# Patient Record
Sex: Female | Born: 1950
Health system: Southern US, Community
[De-identification: ages and names within clinical notes are randomized; demographics above are authoritative.]

## PROBLEM LIST (undated history)

## (undated) DIAGNOSIS — E119 Type 2 diabetes mellitus without complications: Secondary | ICD-10-CM

## (undated) DIAGNOSIS — I10 Essential (primary) hypertension: Secondary | ICD-10-CM

## (undated) DIAGNOSIS — E7800 Pure hypercholesterolemia, unspecified: Secondary | ICD-10-CM

## (undated) HISTORY — PX: DILATION AND CURETTAGE OF UTERUS: SHX78

---

## 2001-04-12 ENCOUNTER — Emergency Department (HOSPITAL_COMMUNITY): Admission: EM | Admit: 2001-04-12 | Discharge: 2001-04-13 | Payer: Self-pay | Admitting: Emergency Medicine

## 2002-07-09 ENCOUNTER — Emergency Department (HOSPITAL_COMMUNITY): Admission: EM | Admit: 2002-07-09 | Discharge: 2002-07-09 | Payer: Self-pay | Admitting: *Deleted

## 2002-07-10 ENCOUNTER — Emergency Department (HOSPITAL_COMMUNITY): Admission: EM | Admit: 2002-07-10 | Discharge: 2002-07-10 | Payer: Self-pay | Admitting: *Deleted

## 2003-07-05 ENCOUNTER — Ambulatory Visit (HOSPITAL_COMMUNITY): Admission: RE | Admit: 2003-07-05 | Discharge: 2003-07-05 | Payer: Self-pay | Admitting: Family Medicine

## 2004-08-05 ENCOUNTER — Inpatient Hospital Stay (HOSPITAL_COMMUNITY): Admission: EM | Admit: 2004-08-05 | Discharge: 2004-08-11 | Payer: Self-pay | Admitting: Emergency Medicine

## 2004-08-11 ENCOUNTER — Encounter (HOSPITAL_COMMUNITY): Admission: RE | Admit: 2004-08-11 | Discharge: 2004-09-10 | Payer: Self-pay | Admitting: Family Medicine

## 2004-08-11 ENCOUNTER — Inpatient Hospital Stay (HOSPITAL_COMMUNITY): Admission: EM | Admit: 2004-08-11 | Discharge: 2004-08-16 | Payer: Self-pay | Admitting: Emergency Medicine

## 2004-09-06 ENCOUNTER — Ambulatory Visit (HOSPITAL_COMMUNITY): Admission: RE | Admit: 2004-09-06 | Discharge: 2004-09-06 | Payer: Self-pay | Admitting: Family Medicine

## 2007-12-16 ENCOUNTER — Ambulatory Visit (HOSPITAL_COMMUNITY): Admission: RE | Admit: 2007-12-16 | Discharge: 2007-12-16 | Payer: Self-pay | Admitting: Family Medicine

## 2009-03-02 ENCOUNTER — Ambulatory Visit (HOSPITAL_COMMUNITY): Admission: RE | Admit: 2009-03-02 | Discharge: 2009-03-02 | Payer: Self-pay | Admitting: Family Medicine

## 2010-03-20 ENCOUNTER — Ambulatory Visit (HOSPITAL_COMMUNITY)
Admission: RE | Admit: 2010-03-20 | Discharge: 2010-03-20 | Payer: Self-pay | Source: Home / Self Care | Attending: Family Medicine | Admitting: Family Medicine

## 2010-08-25 NOTE — Consult Note (Signed)
NAME:  WINDSOR, ZIRKELBACH NO.:  [id number]   MEDICAL RECORD NO.:  [id number]          PATIENT TYPE:  INP   LOCATION:  A226                          FACILITY:  [facility]   PHYSICIAN:  Kofi A. Gerilyn Pilgrim, M.D. DATE OF BIRTH:  [birth year]   DATE OF CONSULTATION:  DATE OF DISCHARGE:                                   CONSULTATION   ASSESSMENT/PLAN:  I believe the pieces of this case are beginning to come  together.  It appears that though she has some alteration of mentation, so  far no clear etiology has been ruled in.  Certainly, a spinal tap would be  good, but she has vigorously resisted this.  I believe that there is a  significant underlying psychiatric problem, especially with new information  that she was hospitalized for a month with bipolar disorder.  We will  complete the course for presumed diagnosis of possible encephalitis.  If she  is willing to be tapped, we may consider tapping her to rule this out;  however, ultimately, I think she will need to have psychiatric therapy.  Again, there is a question in the past of possible seizures but EEG was  normal before and also MRI was normal.  We are also going to start her on  Zyprexa.  Due to elevated liver enzymes, we will hold off on the Depakote  and continue with the Dilantin.   HISTORY:  This is a 60 year old lady who was recently admitted to this  hospital and discharged yesterday.  She presented with unresponsiveness.  From the onset, there is some question of whether or not there is some  volitional problems; however, the patient was having low-grade temperatures  and at one time, I think, had a 100.8 fever.  The workup was essentially  unremarkable.  MRI of the brain was unrevealing.  EEG was normal.  Other  tests, including urinalysis and chest x-ray, were normal.   The patient was intubated and rapidly extubated.  Attempted LP was obtained,  but the patient was very combative and remained combative for most of  her  stay.  She improved gradually and was discharged.  She was treated  empirically with Rocephin and acyclovir, as we could not rule out primary  CNS infection.  She went home and apparently was trying to go to her car,  and her husband tried to restrain her.  She apparently held on and went into  a rage-like mode.  She would not let go from the car.  She became  unresponsive again and was subsequently taken to the emergency room.  In the  emergency room, it appeared that she was apparently holding on to the car,  would not move.  Her eyes were closed.  She was given 8 mg of Haldol and 3mg   ofAtivan .  She apparently has come around and is more cooperative, less  combative.  She does not complain of a headache.  She really has not had any  complaints.   PHYSICAL EXAMINATION:  VITAL SIGNS:  She is afebrile.  Temperature 97.1,  pulse 108, respirations 18,  blood pressure 154/100.  GENERAL:  Patient is smiling but is acting somewhat immature for age.  She  has low spontaneous speech and slowly answers questions.  NEUROLOGIC:  Cranial nerves II-XII are intact.  She moves all four  extremities well.   WBC 7, hemoglobin 10, platelet count 195.  She had a low potassium of 2.7.  Sodium 137.  Chloride 103, CO2 24, glucose 122, creatinine 0.9.  ALT 221,  AST 227.  Calcium 8.3, TSH 1.8.  Alcohol level undetected.  Phenytoin  11.8.  Acetaminophen undetected.  Urinalysis negative.  ANA from previous  hospitalization nonreactive.  RPR nonreactive.   Thanks for this consultation.      KAD/MEDQ  D:  08/11/2004  T:  08/11/2004  Job:  [id number]

## 2010-08-25 NOTE — Group Therapy Note (Signed)
NAME:  Samantha Werner, Samantha Werner NO.:  [id number]   MEDICAL RECORD NO.:  [id number]          PATIENT TYPE:  INP   LOCATION:  A226                          FACILITY:  [facility]   PHYSICIAN:  Angus G. Renard Matter, MD   DATE OF BIRTH:  Mar 07, 1951   DATE OF PROCEDURE:  08/10/2004  DATE OF DISCHARGE:                                   PROGRESS NOTE   SUBJECTIVE:  This patient was admitted with unexplained altered mental  status. She was moved from the ICU to __2A________________ telemetry.  Appears to be more alert.   OBJECTIVE:  VITAL SIGNS:  Blood pressure 143/90, respirations 16, pulse 107,  temperature 99.2.  LUNGS:  Clear to P&A.  HEART:  Regular rhythm.  ABDOMEN:  No palpable organs or masses.   ASSESSMENT:  The patient was admitted with altered mental status, possible  herpetic encephalitis.   PLAN:  Plan to continue current regimen.      AGM/MEDQ  D:  08/10/2004  T:  08/10/2004  Job:  [id number]

## 2010-08-25 NOTE — Group Therapy Note (Signed)
NAME:  Samantha Werner, Samantha Werner NO.:  [id number]   MEDICAL RECORD NO.:  [id number]          PATIENT TYPE:  INP   LOCATION:  IC03                          FACILITY:  [facility]   PHYSICIAN:  Angus G. Renard Matter, MD   DATE OF BIRTH:  Jul 02, 1950   DATE OF PROCEDURE:  DATE OF DISCHARGE:                                   PROGRESS NOTE   SUBJECTIVE:  The patient remains on a respirator, was somewhat agitated and  had to be medicated. She had a change in mental status of unknown cause thus  concerns about herpetic encephalitis and is being treated for this.   OBJECTIVE:  Vital signs, blood pressure 113/69, respirations 10/8, pulse  120, temperature 100. Lungs occasional rhonchus bilaterally. Heart regular  rhythm. Abdomen no palpable __________ or masses. The patient remains  comatose on a respirator.   PLAN:  Continue current regimen. The patient will be seen today by Kofi A.  Gerilyn Pilgrim, M.D.      AGM/MEDQ  D:  08/07/2004  T:  08/07/2004  Job:  [id number]

## 2010-08-25 NOTE — Discharge Summary (Signed)
NAME:  Samantha Werner, Samantha Werner NO.:  [id number]   MEDICAL RECORD NO.:  [id number]          PATIENT TYPE:  INP   LOCATION:  A226                          FACILITY:  [facility]   PHYSICIAN:  Angus G. Renard Matter, MD   DATE OF BIRTH:  20-Nov-1950   DATE OF ADMISSION:  08/11/2004  DATE OF DISCHARGE:  05/10/2006LH                                 DISCHARGE SUMMARY   Five days hospitalization.   DIAGNOSIS:  Combative behavior.   PROBLEM LIST:  Encephalopathy.  History of bipolar disorder.   CONDITION:  Stable and improved at the time of her discharge.   HISTORY OF PRESENT ILLNESS:  This 60 year old African-American female had  been discharged after having spent five days in the hospital.  She was first  admitted to the ED after having become unresponsive at home with mental  confusion and diminished mental status.  She does apparently have a history  of bipolar disorder and had been treated by Dr. Betti Cruz of mental health in  the past and had been on medication for bipolar disorder.  Because of  unresponsiveness, she was electively placed on respirator.  She was admitted  to ICU on her first admission and remained on respirator approximately three  days.  She was seen in consultation by Dr. Juanetta Gosling and Dr. Gerilyn Pilgrim of  neurology.  There was some question concerning whether or not she might have  experience encephalopathy.  A CT of the head was negative.  MRI of the brain  was negative.  EEG was negative.  She could not have a lumbar puncture  because of agitated state.  It was felt she could be discharged home on 14-  day regimen of Rocephin.  This was arranged through home health.  Apparently, she had not been home many hours when according to her husband  she asked for the keys of their car and attempted to drive off in the car.  When he stopped her, she became extremely angry, fought with him.  Apparently, his associates brought her back to the ED.  She was extremely  agitated and  combative.  She was evaluated by ED physician, was given Haldol  which calmed her some.  It was felt she should be brought back to the  hospital to rule out organic causes of her emotional state, and if organic  causes are ruled out, entertain possible psychiatric admission.   PHYSICAL EXAMINATION:  Blood pressure 159/97, heart rate 116, respirations  18.  HEENT:  Eyes:  PERRLA.  TMs are negative.  Oropharynx benign.  Neck is  supple with no JVD or abnormalities.  Lungs:  Clear to auscultation and  percussion.  Heart:  Sinus tachycardia.  Abdomen:  No palpable organs or  masses.  Skin:  Warm and dry.  Extremities:  Free of edema.  Neurologic:  No  focal deficit.   LABORATORY DATA:  Admission CBC WBC 7800, hemoglobin 10.4, hematocrit 30.6.  Chemistries:  Sodium 137, potassium 2.7, chloride 103, CO2 24, glucose 122,  BUN 4, creatinine 0.9.  Calcium 8.3, total protein 6.5.  Subsequent  chemistries on 08/13/04.  Sodium 138, potassium 3.3, chloride 104, CO2 34,  glucose 88, BUN 3.  Again, on Aug 15, 2004, sodium 139, potassium 4.3,  chloride 105, CO2 31, glucose 88, BUN 3, creatinine 0.8.  Liver enzymes:  SGOT 276, SGPT 221, alkaline phosphatase 95, total bilirubin 0.4.  Subsequent liver enzymes Aug 15, 2004.  SGOT 90, SGPT 170, alkaline  phosphatase 110, bilirubin 0.2.  Urinalysis negative.  Electrocardiogram:  Sinus tachycardia.   HOSPITAL COURSE:  The patient was re-admitted and started on a soft diet and  half normal saline 100 mL/hour.  IV Ativan was given q.4h. p.r.n. for  agitation.  She was continued on IV Rocephin 1 g daily.  Acyclovir 10 mg/kg  q.8h. was continued.  Dilantin 100 mg t.i.d. was continued.  She was given  three runs of KCL, 10  milliequivalents, and this was subsequently based on  Zyprexa 10 mg.  She again had to have intravenous potassium.  On Aug 12, 2004, she received two runs of KCl 10 milliequivalents.  Subsequently, based  on KCL p.o. 20 milliequivalents b.i.d., the  patient showed progressive  improvement on this occasion.  She did have to have her potassium corrected.  She was much calmer towards the latter part of her hospital stay, and it was  felt that psychiatric admission was not indicated, although appointments  were made for her to be seen by mental health facility locally.  She was  able to be discharged on Dilantin 100 mg t.i.d., Zyprexa 10 mg daily, K-Dur  20 milliequivalents daily, IV Rocephin 1 g daily to be continued as an  outpatient.      AGM/MEDQ  D:  09/05/2004  T:  09/05/2004  Job:  [id number]

## 2010-08-25 NOTE — Group Therapy Note (Signed)
NAME:  Samantha Werner, Samantha Werner NO.:  [id number]   MEDICAL RECORD NO.:  [id number]          PATIENT TYPE:  INP   LOCATION:  IC03                          FACILITY:  [facility]   PHYSICIAN:  Kofi A. Gerilyn Pilgrim, M.D. DATE OF BIRTH:  Oct 31, 1950   DATE OF PROCEDURE:  08/08/2004  DATE OF DISCHARGE:                                   PROGRESS NOTE   The patient's T-max. was 100.5, blood pressure 122/82, pulse 106,  respirations 18.  The nurse says the patient was more responsive during the  daytime today. This was also concurred by her husband. She apparently was  complaining about the nasal cannular and wanted this to be removed.  The  nurse asked her if she would awaken and eat her meal. Iif she would be able  to have it removed.  She suddenly got up and ate her food.  She has been  less responsive this evening.   On examination, she lies in bed with eyes closed.  She vigorously resists  eye opening, turning her face away in the other direction and lifting her  hands up.  She moves all four extremities vigorously.  Dilantin level is  15.3.   ASSESSMENT AND PLAN:  Unexplained altered mental status.  Continue with  current antibiotics.  EEG has been ordered, and we will follow up results.  We will also go ahead and order MRI scan.      KAD/MEDQ  D:  08/08/2004  T:  08/08/2004  Job:  [id number]

## 2010-08-25 NOTE — Group Therapy Note (Signed)
NAME:  Samantha Werner, Samantha Werner NO.:  [id number]   MEDICAL RECORD NO.:  [id number]          PATIENT TYPE:  INP   LOCATION:  IC03                          FACILITY:  [facility]   PHYSICIAN:  Edward L. Juanetta Gosling, M.D.DATE OF BIRTH:  October 16, 1950   DATE OF PROCEDURE:  08/05/2004  DATE OF DISCHARGE:                                   PROGRESS NOTE   I have discussed by phone Ms. Wickens situation with Dr. Gerilyn Pilgrim and he  agrees that we should go ahead with treatment for herpes encephalitis, add  Rocephin and he will see her later.  He is not on this weekend and probably  will see her next week but he thinks that we should go ahead with these  interventions and did not have any other suggestions on telephone  consultation.      ELH/MEDQ  D:  08/05/2004  T:  08/05/2004  Job:  [id number]   cc:   Darleen Crocker A. Gerilyn Pilgrim, M.D.  [address], [place]  [place] [facility] [place]  [place] [address]  Fax: [phone]

## 2010-08-25 NOTE — Group Therapy Note (Signed)
NAME:  NOVALYNN, BRANAMAN NO.:  [id number]   MEDICAL RECORD NO.:  [id number]          PATIENT TYPE:  INP   LOCATION:  A226                          FACILITY:  [facility]   PHYSICIAN:  Mila Homer. Sudie Bailey, M.D.DATE OF BIRTH:  [birth year]   DATE OF PROCEDURE:  08/13/2004  DATE OF DISCHARGE:                                   PROGRESS NOTE   SUBJECTIVE:  The patient has really no complaints today.   OBJECTIVE:  VITAL SIGNS:  Temperature is 98.7, pulse 98, respiratory rate  18, blood pressure 120/76.  GENERAL:  She is sitting up in bed.  She is in no acute distress.  Well-  developed, somewhat obese.  Appears to be alert.  LUNGS:  Clear throughout today.  HEART:  Regular rhythm with a rate of 70.  ABDOMEN:  Soft and mildly obese without hepatosplenomegaly or mass or  tenderness, and there is no edema of the ankle.   LABORATORY DATA:  Her MET-7 showed a potassium of 3.3 today.   ASSESSMENT:  1.  Combative behavior pattern, which is felt to be possibly secondary to      encephalitis.  2.  Hypokalemia.  3.  Bipolar disorder.   PLAN:  She is to continue IV half normal saline 100 cc an hour, Ativan  p.r.n. agitation, Rocephin 1 gm IV daily, and acyclovir 10 mg IV q.12h.  She  is Dilantin 100 mg t.i.d., and 2 days ago had 3 runs of KCl and 2 runs  yesterday.  I have now put her on just KCl 20 b.i.d. to add to her other  medications.      SDK/MEDQ  D:  08/13/2004  T:  08/14/2004  Job:  [id number]

## 2010-08-25 NOTE — Group Therapy Note (Signed)
NAME:  Samantha Werner, Samantha Werner NO.:  [id number]   MEDICAL RECORD NO.:  [id number]          PATIENT TYPE:  INP   LOCATION:  IC03                          FACILITY:  [facility]   PHYSICIAN:  Edward L. Juanetta Gosling, M.D.DATE OF BIRTH:  29-Apr-1950   DATE OF PROCEDURE:  08/07/2004  DATE OF DISCHARGE:                                   PROGRESS NOTE   Patient of Dr. Renard Matter   PROBLEM:  1.  Respiratory failure.  2.  Agitation.   SUBJECTIVE:  Samantha Werner apparently still is agitated when her Diprivan is  decreased.  She has no new complaints this morning.  No new problem has been  noted by the nursing staff thus far.   PHYSICAL EXAMINATION:  VITAL SIGNS:  Heart rate 127, blood pressure 118/70.  Blood cultures thus far are negative.  Laboratory work from this morning is  pending.  CHEST:  Fairly clear with some rhonchi.  She has some secretions.  ABDOMEN:  Soft.  CNS:  She is very sedated right now, but apparently does continue to get  agitated and confused.   ASSESSMENT:  She is perhaps some better.   PLAN:  Try for reduction in ventilator support and see how she does.      ELH/MEDQ  D:  08/07/2004  T:  08/07/2004  Job:  [id number]

## 2010-08-25 NOTE — Group Therapy Note (Signed)
NAME:  Samantha Werner, Samantha Werner NO.:  [id number]   MEDICAL RECORD NO.:  [id number]          PATIENT TYPE:  INP   LOCATION:  A226                          FACILITY:  [facility]   PHYSICIAN:  Kofi A. Gerilyn Pilgrim, M.D. DATE OF BIRTH:  07-Sep-1950   DATE OF PROCEDURE:  08/09/2004  DATE OF DISCHARGE:                                   PROGRESS NOTE   She has been afebrile today.  T-max 99.8, pulse 102, respirations 20, blood  pressure 127/91.  The patient had a better day today.  She apparently has  been transferred to 2A.  Daughter is here.  They indicate that the patient  has been awake and conversational talking and seemed appropriate.  She did  eat two meals today.  She apparently has also been jovial and laughing with  people who came back to visit her.  The story indicates that the patient  apparently did not feel well on Friday and had a headache.  The daughter  said the patient apparently had a few staring spells and then later had a  spell which they thought was a seizure characterized by stiffening and  shaking.  Patient does not have any complaints today.  She seems less  responsive to me than the family indicates today although she is awake,  alert and follows commands.  She did not verbalize any words while I was  present.  Extraocular movements were intact.  She moves all four  extremities.  Visual fields are intact.  Neck is supple.   MRI of the brain was completed and was reviewed.  There was no contrast.  There is no abnormalities present on diffusion imaging, blood vessels appear  normal, no abnormal enhancement is noted.  EEG was done and essentially is  unremarkable.   ASSESSMENT AND PLAN:  Unexplained encephalopathy, improving.  The  description of the spell by the husband and now by a daughter appears to be  consistent with seizure activity.  I think we should maintain her on  Dilantin.  We will check a level tomorrow.  Unfortunately because we are not  able to  assess her spinal fluid we were unable to tell if she has a  cerebrospinal fluid infection.  I think we probably are going to be left to  treat her with both acyclovir and Rocephin for 10-14 days unless of course  she will have a response, hopefully, this will be the case.  May be able to  do it as an outpatient setting and switch the acyclovir to [employer].  I  think there is a clear indication of underlying depression or psychiatric  problems.  The daughters today indicated that she has been somewhat down  about not being able to work over the past year because not finding any type  of job.  We will go ahead and start her on Lexapro.  Blood tests will be  done including typical dementia labs, RPR, TSH, homocysteine level, vitamin  B12 level, and ANA.      KAD/MEDQ  D:  08/09/2004  T:  08/09/2004  Job:  [id number]

## 2010-08-25 NOTE — Consult Note (Signed)
NAME:  Samantha Werner, Samantha Werner NO.:  [id number]   MEDICAL RECORD NO.:  [id number]          PATIENT TYPE:  INP   LOCATION:  IC03                          FACILITY:  [facility]   PHYSICIAN:  Edward L. Juanetta Gosling, M.D.DATE OF BIRTH:  [birth year]   DATE OF CONSULTATION:  08/05/2004  DATE OF DISCHARGE:                                   CONSULTATION   MEDICINE CONSULTATION:   REASON FOR CONSULTATION:  Help with ventilator management.   HISTORY:  Ms. Hurtado came to the emergency room on April 28 with what was  described as respiratory distress but which appeared to be more of an almost  combative state with confusion.  She had apparently been sick for a few days  anyway just not feeling very well but with nothing very specific.  She  cannot give any history now.  When she was seen in the emergency room she  was difficult to manage, had difficulty with getting any history from her  and evaluation of her problems was difficult as well because of her  combativeness.  She eventually underwent a number of studies including a CT  scan of the brain and had lab work done but ended up being intubated and  placed on mechanical ventilator mostly for airway protection because it was  so difficult to get her sedated enough to do anything that it was felt that  she was sedated enough that she could aspirate.  She is now in the intensive  care unit on a ventilator.  She has no known drug allergies.  She had drug  screen that was negative for alcohol and she had a urine drug screen that I  think was negative for everything but benzodiazepines which may be the  problem, it is not clear however, if she took benzodiazepines before coming  to the emergency room.  She did receive versed in the ER.   EXAMINATION:  Her physical exam now shows that she is intubated on a  ventilator unresponsive but she is still agitated and she is still trying to  sit up.  She has been on Diprivan, Diprivan dose has had to be  increased to  keep her sedated enough to keep her from extubating herself, etc.  Her chest  is fairly clear.  Heart is regular without gallop.  Her abdomen is soft  without masses.  Her pulse is 108, blood pressure 124/91, O2 saturation 100%  on the ventilator.  She does not have any edema.   ASSESSMENT:  She has had a change in mental status, the cause of this is  really not very clear.  She does not have very high white count, she does  not have a positive drug screen, she does not have any obvious cause, did  not have stroke by CT criteria at least.  She does move all four  extremities.   My assessment then is that she has respiratory failure as mentioned reasons  are unclear, change in mental status reasons are unclear, I am going to go  ahead and ask for neurology consultation, continue with her current  medications and treatments.  I do not think there is a great deal to add at  this point.  I suppose that at some point she may need a lumbar puncture.  This does not look like the typical sort of problem with a meningitis.  I  suppose we  should consider treating her for herpetic encephalitis although again she is  not febrile but we will plan to go ahead and start her on Zovirax because of  that possibility, as mentioned we will ask for a consultation with  neurology, continue all the other treatments and follow.      ELH/MEDQ  D:  08/05/2004  T:  08/05/2004  Job:  [id number]

## 2010-08-25 NOTE — H&P (Signed)
NAME:  Samantha Werner, Samantha Werner NO.:  [id number]   MEDICAL RECORD NO.:  [id number]          PATIENT TYPE:  INP   LOCATION:  A226                          FACILITY:  [facility]   PHYSICIAN:  Angus G. Renard Matter, MD   DATE OF BIRTH:  20-Jul-1950   DATE OF ADMISSION:  08/11/2004  DATE OF DISCHARGE:  LH                                HISTORY & PHYSICAL   A 60 year old African-American female who was just discharged today after  having spent five days in the hospital.  She was admitted first through the  ED after having become unresponsive at home with mental confusion and  decreased mental status.  She does apparently have a history of bipolar  disorder and had been treated by Dr. Betti Cruz of mental health in the past and  had been on medication for bipolar disorder.  Because of unresponsiveness,  she was electively placed on the respirator.  For the first admission, was  admitted to the ICU.  She remained on the respirator for approximately three  days.  Was seen in consultation by Dr. Juanetta Gosling.  There was some question as  to whether or not she might have aspirated.  She was taken off the  respirator and seemed to be doing better.  She was seen in consultation by  Dr. Gerilyn Pilgrim, who felt that she might have experienced encephalopathy.  A CT  of the head was negative.  MRI of the brain was negative.  EEG was negative.  She could not have a lumbar puncture because she was intermittently agitated  throughout her hospitalization.  I discussed the situation with Dr.  Gerilyn Pilgrim, who felt that she could go home on a 14-day regimen of Rocephin.  This was arranged through home health.  Apparently, she had not been home  many hours when, according to her husband, she asked for the keys to the car  and was attempting to drive off in the car.  When he stopped her, she became  extremely angry and fought with him.  Apparently, his associates brought her  back to the ED in this fashion and was extremely  agitated and combative.  She was evaluated by the ED physician.  Was given Haldol, which calmed her  some.  I discussed the situation again with Dr. Gerilyn Pilgrim.  It was felt that  she should be brought back into the hospital to rule out organic causes of  her emotional state and then if organic causes had been completely ruled  out, we will entertain possible psychiatric admission.   SOCIAL HISTORY:  Patient does drink alcohol.  Does not smoke.   FAMILY HISTORY:  Not available.   PAST MEDICAL HISTORY:  Patient does have a history of bipolar disorder and  has seen psychiatry in the past.   Has no prior history of surgery.   ALLERGIES:  None known.   MEDICATIONS:  Patient was discharged on IV Rocephin 1 gm daily, Dilantin 100  mg every 8 hours, Lexapro 10 mg daily.   REVIEW OF SYSTEMS:  HEENT:  Negative.  CARDIOPULMONARY:  No cough or  hemoptysis.  GI:  No bowel irregularity or bleeding.  GU:  No dysuria or  hematuria.   PHYSICAL EXAMINATION:  VITAL SIGNS:  Blood pressure 159/97, heart rate 116,  respirations 18.  GENERAL:  An agitated African-American female.  HEENT:  PERRLA.  TM's negative.  Oropharynx benign.  NECK:  Supple.  No JVD or thyroid abnormalities.  LUNGS:  Clear to P&A.  HEART:  Sinus tachycardia.  ABDOMEN:  No palpable organs or masses.  SKIN:  Warm and dry.  EXTREMITIES:  Free of edema.  NEUROLOGIC:  No focal deficits.  Patient would not open her eyes for  assessment.   DIAGNOSIS:  Combative behavior pattern, possible encephalopathy.  History of  bipolar disorder.      AGM/MEDQ  D:  08/11/2004  T:  08/11/2004  Job:  [id number]

## 2010-08-25 NOTE — Consult Note (Signed)
NAME:  Samantha Werner, Samantha Werner NO.:  [id number]   MEDICAL RECORD NO.:  [id number]          PATIENT TYPE:  INP   LOCATION:  IC03                          FACILITY:  [facility]   PHYSICIAN:  Kofi A. Gerilyn Pilgrim, M.D. DATE OF BIRTH:  [birth year]   DATE OF CONSULTATION:  DATE OF DISCHARGE:                                   CONSULTATION   IMPRESSION:  Unexplained encephalopathy.  Etiologies include primary central  nervous system infection, including meningitis and encephalitis.  Seizure is  also a possibility, given the witness of the husband that she apparently had  a couple of spells of tonic/clonic activity.  Primary ischemic event is a  possibility, but I think it is less likely.  Initial scan was negative.  There are no focal findings on limited physical exam that we could see.   RECOMMENDATIONS:  1.  Spinal tap.  This was explained to the patient's husband, and he is      willing to proceed.  2.  EEG.  3.  Empiric Dilantin.  4.  Neurologic exam.  We may consider MRI, but the patient seems agitated.      I doubt we can do this.  I really do not want to sedate her, if we can      avoid this.   HISTORY:  This is a 60 year old African-American female who on Thursday  night, her husband reported was not herself.  She was disoriented.  She  appeared to return to her baseline cognition on Friday morning and  throughout the daytime; however, the patient's daughters report that she  became unresponsive and subsequently appeared to have complex spells where  her arms were flexed towards her chest and really stiffened.  The husband  indicates that the patient did not lose consciousness but was unresponsive.  Her arms and her legs were stiffened.  No urinary incontinence was reported.  She was taken to the hospital for further evaluation.  The husband endorsed  that she has had some cough before, but from what I could tell, that  continued to be nonproductive.  The patient does have a  history of  depression and recently lost her job, about a year ago, and has not been  able to find a job.  She was treated for depression about 10 years ago.  The  patient was intubated and placed on CSF doses of Rocephin.  She was also  placed on acyclovir.  She has had sort of a low-grade fever off and on, and  one time, it even went to 101.  So far, workup for the cause of her low-  grade fever has been negative.  She has been combative from time to time.   PAST MEDICAL HISTORY:  Relatively unavailable at this time, apart from  depression.   CURRENT MEDICATIONS:  1.  Ceftriaxone 2 gm daily.  2.  Levaquin.  3.  Acyclovir 500 mg q.12h.  4.  Ativan.   SOCIAL HISTORY:  Apparently has a history of alcohol ingestion, quantity  unknown.   ALLERGIES:  None known.   PHYSICAL EXAMINATION:  VITAL SIGNS:  Latest temperature 100.9.  Before that,  100.4 and 100.6.  Pulse 108.  Respirations 21.  Blood pressure 136/94.  GENERAL:  She is extubated.  NECK:  Supple.  LUNGS:  Clear to auscultation bilaterally.  CARDIOVASCULAR:  Normal S1 and S2.  ABDOMEN:  Soft.  EXTREMITIES:  No edema or varicosities.  SKIN:  Unremarkable.  NEUROLOGICAL:  Mentation:  She basically has eyes closed.  She resists  vigorous eye-opening and essentially is resistant to the neurological  evaluation.  She is lying in bed, well rested, until the neurological  evaluation was started.  She then appeared to flex her hands towards her  face with some rigidity.  This movement appeared to be semi-purposeful.  There is some grunting activity noted.  Motor:  She moves all four  extremities well.  She seemed to respond to pain.  Sensation:  She responds  to pain on both sides.   CT scan of the brain is negative for any acute process.   WBC on admission 8.  Went up to 17 but is now down to 12.  Sodium 139,  potassium 3.4, chloride 18, glucose 189, BUN 12, creatinine 1.4, calcium  8.7.  AST 51, ALT 46.  Acetaminophen level  negative.  Urine drug screen  positive for benzodiazepine.  Urinalysis essentially unremarkable.      KAD/MEDQ  D:  08/07/2004  T:  08/07/2004  Job:  [id number]

## 2010-08-25 NOTE — Group Therapy Note (Signed)
NAME:  Samantha Werner, Samantha Werner NO.:  [id number]   MEDICAL RECORD NO.:  [id number]          PATIENT TYPE:  INP   LOCATION:  IC03                          FACILITY:  [facility]   PHYSICIAN:  Edward L. Juanetta Gosling, M.D.DATE OF BIRTH:  [birth year]   DATE OF PROCEDURE:  DATE OF DISCHARGE:                                   PROGRESS NOTE   SUBJECTIVE:  Samantha Werner was extubated yesterday.  She is more alert.  She  has no new complaints.   OBJECTIVE:  VITAL SIGNS:  Her temperature is 100.2, pulse is 101,  respirations 16, blood pressure 116/78.   ASSESSMENT:  Dr. Gerilyn Pilgrim did see her but felt that she was perhaps  psychotic.  He had added empiric Dilantin and attempted a spinal tap, but  because of her combativeness was not able to complete that.  She has been  extubated and she got a Dilantin level this morning of 15.3.  The chest is  clear.  She is still poorly responsive.      ELH/MEDQ  D:  08/08/2004  T:  08/08/2004  Job:  [id number]

## 2010-08-25 NOTE — Group Therapy Note (Signed)
NAME:  Samantha Werner, Samantha Werner NO.:  [id number]   MEDICAL RECORD NO.:  [id number]          PATIENT TYPE:  INP   LOCATION:  A226                          FACILITY:  [facility]   PHYSICIAN:  Angus G. Renard Matter, MD   DATE OF BIRTH:  09-Feb-1951   DATE OF PROCEDURE:  08/12/2004  DATE OF DISCHARGE:                                   PROGRESS NOTE   SUBJECTIVE:  This patient was readmitted to the hospital after having become  combative and having exhibited combative behavior. Her vital signs and neuro  checks remained stable.   OBJECTIVE:  VITAL SIGNS:  BP 128/88, respirations 20, pulse 110, temperature  98.8. Her potassium was low on admission, 2.7. She received IV potassium,  and current potassium is 3.2.  LUNGS:  Clear to P&A.  HEART:  Irregular rhythm.  ABDOMEN:  No palpable organs or masses.   ASSESSMENT:  The patient was admitted with combative behavior, possibly  encephalopathy. She does have a history of bipolar disorder, possible  herpetic encephalitis.   PLAN:  Plan to continue current IV antibiotics and acyclovir. She is being  seen by neurology.      AGM/MEDQ  D:  08/12/2004  T:  08/12/2004  Job:  [id number]

## 2010-08-25 NOTE — Procedures (Signed)
NAME:  AMYLYNN, FANO NO.:  [id number]   MEDICAL RECORD NO.:  [id number]          PATIENT TYPE:  INP   LOCATION:  A226                          FACILITY:  [facility]   PHYSICIAN:  Kofi A. Gerilyn Pilgrim, M.D. DATE OF BIRTH:  1951/02/16   DATE OF PROCEDURE:  DATE OF DISCHARGE:                                EEG INTERPRETATION   HISTORY:  This is a 60 year old lady who presented with altered mental  status since an unwitnessed event suspicious for seizure activity.   ANALYSIS:  A 16-channel recording was conducted for approximately 25  minutes.  There was a posterior reading of 8.5-9 Hz bilaterally.  There was  some beta activity seen in the frontal areas.  The recording is marginal as  there is significant lead popping artifact noted from time to time  throughout the recording.  Photic stimulation was conducted, but no  significant changes were observed in the background activity.  There is no  focal slowing, lateralized slowing or epileptiform activity noted.   IMPRESSION:  This is an unremarkable recording of the wake state.      KAD/MEDQ  D:  08/09/2004  T:  08/09/2004  Job:  [id number]

## 2010-08-25 NOTE — Group Therapy Note (Signed)
NAME:  TASHEKA, HOUSEMAN NO.:  [id number]   MEDICAL RECORD NO.:  [id number]          PATIENT TYPE:  INP   LOCATION:  A226                          FACILITY:  [facility]   PHYSICIAN:  Edward L. Juanetta Gosling, M.D.DATE OF BIRTH:  Nov 14, 1950   DATE OF PROCEDURE:  08/09/2004  DATE OF DISCHARGE:                                   PROGRESS NOTE   PROBLEM:  Change in mental status.   Ms. Sen has been extubated. She has been moved from the intensive care  unit now. The problem appears to be  more of trying to figure out what we  need to do about her mental status and I am going to plan to sign off at  this point. Thank you very much for allowing me to see her with you.      ELH/MEDQ  D:  08/09/2004  T:  08/09/2004  Job:  [id number]

## 2010-08-25 NOTE — Group Therapy Note (Signed)
NAME:  Samantha Werner, Samantha Werner NO.:  [id number]   MEDICAL RECORD NO.:  [id number]          PATIENT TYPE:  INP   LOCATION:  IC03                          FACILITY:  [facility]   PHYSICIAN:  Edward L. Juanetta Gosling, M.D.DATE OF BIRTH:  1951/02/15   DATE OF PROCEDURE:  08/06/2004  DATE OF DISCHARGE:                                   PROGRESS NOTE   PROBLEM:  1.  Change in mental status.  2.  Respiratory failure related to #1.   SUBJECTIVE:  Ms. Klinedinst remains about the same.  She is still somewhat  agitated.  She is intubated on the ventilator and sedated now.   PHYSICAL EXAMINATION:  VITAL SIGNS:  Her pulse rate is about 115, blood  pressure 121/82, O2 saturation 100%.  CHEST:  Some mild rhonchi bilaterally.  HEART:  Regular with no gallop.   LABORATORY DATA:  Glucose is 103.  BMET, otherwise is normal.  Blood gas on  40% 600 rate of 8, is pH 7.38, PCO2 of 36, PO2 of 185.  Her CBC shows white  count is 12,900, hemoglobin is 11.2, platelets 211.  All of the culture thus  far are pending.   ASSESSMENT:  She has change in mental status of unknown cause.  Concerns of  course about herpetic encephalitis.   PLAN:  Continue with acyclovir, continue ventilator support, continue all of  the other treatments and medications.  We will start tube feedings today.      ELH/MEDQ  D:  08/06/2004  T:  08/06/2004  Job:  [id number]

## 2010-08-25 NOTE — Group Therapy Note (Signed)
NAME:  Samantha Werner, KNUST NO.:  [id number]   MEDICAL RECORD NO.:  [id number]          PATIENT TYPE:  INP   LOCATION:  A226                          FACILITY:  [facility]   PHYSICIAN:  Angus G. Renard Matter, MD   DATE OF BIRTH:  28-Dec-1950   DATE OF PROCEDURE:  08/14/2004  DATE OF DISCHARGE:                                   PROGRESS NOTE   SUBJECTIVE:  This patient was admitted with combative behavior, possible  encephalitis, hypokalemia, bipolar disorder.   OBJECTIVE:  VITAL SIGNS:  Blood pressure 97/58, respirations 20, pulse 105,  temperature 97.5.  LUNGS:  Diminished breath sounds.  HEART:  Irregular rhythm.  ABDOMEN:  No palpable organs or masses.   ASSESSMENT:  The patient was admitted with what was felt to be combative  behavior secondary to encephalitis, hypokalemia, bipolar disorder.   PLAN:  Plan to continue current regimen. The patient will be seen by Dr.  Gerilyn Pilgrim today as part of further evaluation.      AGM/MEDQ  D:  08/14/2004  T:  08/14/2004  Job:  [id number]

## 2010-08-25 NOTE — Group Therapy Note (Signed)
NAME:  Samantha Werner, Samantha Werner NO.:  [id number]   MEDICAL RECORD NO.:  [id number]          PATIENT TYPE:  INP   LOCATION:  A226                          FACILITY:  [facility]   PHYSICIAN:  Angus G. Renard Matter, MD   DATE OF BIRTH:  [birth year]   DATE OF PROCEDURE:  08/11/2004  DATE OF DISCHARGE:                                   PROGRESS NOTE   SUBJECTIVE:  The patient was admitted to the hospital with unexplained  altered mental status. She appears to be more alert.   OBJECTIVE:  VITAL SIGNS:  Blood pressure 150/82, respirations 24, pulse 108,  temperature 98.4.  LUNGS:  Clear to P&A.  HEART:  Regular rhythm.  ABDOMEN:  No palpable organs or masses.   ASSESSMENT:  The patient was admitted with altered mental status, possible  herpetic encephalitis.   PLAN:  Plan to continue current regimen.      AGM/MEDQ  D:  08/11/2004  T:  08/11/2004  Job:  [id number]

## 2010-08-25 NOTE — Group Therapy Note (Signed)
NAME:  Samantha Werner, Samantha Werner NO.:  [id number]   MEDICAL RECORD NO.:  [id number]          PATIENT TYPE:  INP   LOCATION:  IC03                          FACILITY:  [facility]   PHYSICIAN:  Angus G. Renard Matter, MD   DATE OF BIRTH:  25-Sep-1950   DATE OF PROCEDURE:  DATE OF DISCHARGE:                                   PROGRESS NOTE   This patient has started to become more rational, but she was agitated  through the night. Attempts were made to perform lumbar puncture  unsuccessfully. She is seen in consultation by Dr. Gerilyn Pilgrim, neurologist and  started on Dilantin, thiamine. EEG was ordered.   OBJECTIVE:  VITAL SIGNS:  Blood pressure 87/73, respirations 18, pulse 106,  temperature 99.2.  LUNGS:  Occasional rhonchus.  HEART:  Regular rhythm.  ABDOMEN:  No palpable organs or masses.   ASSESSMENT:  Change in mental status from unknown cause, possible herpetic  encephalitis.   PLAN:  To continue same arrangement.      AGM/MEDQ  D:  08/08/2004  T:  08/08/2004  Job:  [id number]

## 2010-08-25 NOTE — Group Therapy Note (Signed)
NAME:  ANNASOPHIA, CROCKER NO.:  [id number]   MEDICAL RECORD NO.:  [id number]          PATIENT TYPE:  INP   LOCATION:  IC03                          FACILITY:  [facility]   PHYSICIAN:  Angus G. Renard Matter, MD   DATE OF BIRTH:  [birth year]   DATE OF PROCEDURE:  08/09/2004  DATE OF DISCHARGE:                                   PROGRESS NOTE   SUBJECTIVE:  This patient at times is combative. She is being followed by  neurology. MRI of brain was ordered.   OBJECTIVE:  VITAL SIGNS:  Blood pressure 141/96, respirations 18, pulse 116,  temperature 99.3.  LUNGS:  Clear to P&A.  HEART:  Regular rhythm.  ABDOMEN:  No palpable organs or masses.   ASSESSMENT:  Change in mental status. Possible herpetic encephalitis.   PLAN:  Plan to continue regimen. Continue assessment with MRI, etc.      AGM/MEDQ  D:  08/09/2004  T:  08/09/2004  Job:  [id number]

## 2010-08-25 NOTE — H&P (Signed)
NAME:  Samantha Werner, TRANG NO.:  [id number]   MEDICAL RECORD NO.:  [id number]          PATIENT TYPE:  INP   LOCATION:  IC03                          FACILITY:  [facility]   PHYSICIAN:  Angus G. Renard Matter, MD   DATE OF BIRTH:  February 04, 1951   DATE OF ADMISSION:  08/05/2004  DATE OF DISCHARGE:  LH                                HISTORY & PHYSICAL   HISTORY OF PRESENT ILLNESS:  60 year old African-American female, who became  unresponsive at home with mental confusion and decreased mental status, who  presented herself to the emergency room in this fashion. The patient did  have CT done, which showed no acute disease. Chest x-ray showed mild  bronchitic changes. Emergency room physician felt that she may be aspirating  and had become unresponsive and elected to place the patient on the  ventilator, which was done successfully. The patient was admitted to ICU.   LABORATORY DATA:  On admission, WBC 8,300, hemoglobin 12.1, hematocrit 35%.  Chemistry showed a sodium of 139, potassium 3.4, chloride 107, CO2 18.0,  glucose 189, BUN ______, creatinine 1.4, calcium 8.7, SGOT 51.0, SGPT 46.0,  total bilirubin 0.6. The patient had drug screen positive benzodiazepine.   SOCIAL HISTORY:  The patient has a history of alcohol ingestion, quantity  unknown.   FAMILY HISTORY:  Not available.   PAST MEDICAL AND SURGICAL HISTORY:  Not available.   ALLERGIES:  None known.   REVIEW OF SYSTEMS:  HEENT: Negative. CARDIOPULMONARY:  The patient has had  some dyspnea. She had no bowel irregularity, ____________, hematuria.   PHYSICAL EXAMINATION:  GENERAL: Mentally confused, African-American female  on a ventilatory at the time of this exam.  VITAL SIGNS:  Blood pressure 133/101, respirations 12, pulse 123.  HEENT:  Pupils are equal, round, reactive to light and accommodation.  Tympanic membranes negative. Oropharynx benign.  NECK:  Supple, no jugular venous distention or thyroid abnormalities.  HEART:  Regular rhythm.  ABDOMEN:  No palpable organs or masses.  LUNGS:  Clear to percussion and auscultation.  SKIN:  Warm and dry.  NEUROLOGICAL:  No motor deficit.   DIAGNOSIS:  Mental confusion with possible aspiration.      AGM/MEDQ  D:  08/05/2004  T:  08/05/2004  Job:  [id number]

## 2010-08-25 NOTE — Discharge Summary (Signed)
NAME:  Samantha Werner, Samantha Werner NO.:  [id number]   MEDICAL RECORD NO.:  [id number]          PATIENT TYPE:  INP   LOCATION:  A226                          FACILITY:  [facility]   PHYSICIAN:  Angus G. Renard Matter, MD   DATE OF BIRTH:  1950/04/11   DATE OF ADMISSION:  08/05/2004  DATE OF DISCHARGE:  05/05/2006LH                                 DISCHARGE SUMMARY   DIAGNOSES:  1. Mental confusion.  2. Possible aspiration.  3. Respiratory failure.  4. Encephalopathy.  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     CONDITION:  Stable and improved at the time of her discharge.   HISTORY:  This 60 year old African-American female became unresponsive at  home with mental confusion and decreased mental status.  She presented in  this fashion to the ED.  The patient did have a CT of head done which showed  no acute disease.  Chest x-ray showed mild bronchitic changes.  Emergency  room physician felt that she could be aspirating, had become unresponsive  and elected to place her on ventilator.  This was done successfully.  She  was admitted to ICU.  The patient has a past history of bipolar disorder.   PHYSICAL EXAMINATION:  A mentally confused African-American female on  ventilator.  VITAL SIGNS:  Blood pressure 133/101, respirations 12, pulse 123.  HEENT:  Eyes PERRLA.  TMs negative.  Oropharynx benign.  NECK:  Supple.  No JVD or thyroid abnormalities.  HEART:  Regular rhythm.  No murmurs.  ABDOMEN:  No palpable masses or organs.  LUNGS:  Clear to P&A.  NEUROLOGIC:  No motor or sensory deficits.   LABORATORY DATA:  Admission CBC:  WBC 8300 with hemoglobin 12.1, hematocrit  35.0.  Subsequent CBC on 08/06/04:  WBC 12,900 with hemoglobin 11.2,  hematocrit 32.5.  Chemistry:  Sodium 139, potassium 3.4, chloride 107, CO2  18, glucose 189, BUN 12, creatinine 1.4, calcium 8.7, total protein 7.3.  Subsequent chemistries on 08/06/04:  Sodium 140, potassium 4.2, chloride 110,  CO2 24, glucose 103, BUN  10, creatinine 1.3, calcium 8.4.  Liver enzymes:  SGOT 51, SGPT 46, alkaline phosphatase 62, homocysteine 11.70.  TSH 1.857.  Phenytoin level 15.3 on 08/08/04 and 08/09/04, 20.6.  Acetaminophen level less  than 10.  Blood screen positive for benzodiazepine.  Urinalysis positive  protein.  Blood cultures no growth.   X-RAYS:  Chest x-ray on admission showed minimal bronchitic changes.  CT of  the head showed no intracranial abnormalities.  MRI of the brain with and  without contrast, no evidence of acute intracranial abnormality.  A small  moderate central disk protrusion, C3-4, C4-5, C5-6.   EEG interpretation unremarkable recording from awake state.   HOSPITAL COURSE:  The patient initially because of sluggish respirations was  placed in ICU on respirator.  She was continued on Levaquin 500 mg every 24  hours and was given Diprivan to control agitation.  She was also started on  Rocephin 2 g IV every 24 hours and Zovirax.  She was seen by neurologist,  Dr.  Gerilyn Pilgrim.  Zovirax was continued at 500  mg IV q.12h.  The patient was  extubated on Aug 07, 2004, which was done successfully.  The patient remained  stable.  She was seen in consultation by Dr. Gerilyn Pilgrim who ordered EEG,  dilantin, MRI of the brain.  She was found to have a normal CT of the brain  with no acute process.  He felt that she should be treated for herpes  encephalitis and added Rocephin to her regimen along with the Zovirax.  The  patient was more responsive toward the latter part of her hospital stay.  It  was felt that her unexplained encephalopathy was improving.  Dr. Gerilyn Pilgrim  recommended we maintain the dilantin and check dilantin levels and continue  acyclovir.  The patient was started on Lexapro.  The patient improved during  her six-day hospitalization.  Arrangements were made for her to continue her  IV Rocephin at home by nursing facility.  The patient was discharged on this  drug and Lexapro 10 mg daily, dilantin  100 mg t.i.d.  The patient was stable  at the time of her discharge.      AGM/MEDQ  D:  09/04/2004  T:  09/04/2004  Job:  [id number]

## 2010-08-25 NOTE — Group Therapy Note (Signed)
NAME:  HOA, BRIGGS NO.:  [id number]   MEDICAL RECORD NO.:  [id number]          PATIENT TYPE:  INP   LOCATION:  A226                          FACILITY:  [facility]   PHYSICIAN:  Angus G. Renard Matter, MD   DATE OF BIRTH:  05-08-1950   DATE OF PROCEDURE:  DATE OF DISCHARGE:                                   PROGRESS NOTE   This patient was admitted with combative behavior, hypokalemia, bipolar  disorder, possible encephalitis.  She is more alert.   OBJECTIVE:  VITAL SIGNS:  Blood pressure 122/92, respirations 20, pulse 96,  temperature 97.3.  LUNGS:  Diminished breath sounds.  HEART:  Irregular rhythm.  ABDOMEN:  No palpable organs or masses.   ASSESSMENT:  Patient was admitted with combative behavior, hypokalemia,  bipolar disorder.   PLAN:  Continue current regimen.  Will discuss with Dr. Gerilyn Pilgrim concerning  disposition.      AGM/MEDQ  D:  08/15/2004  T:  08/15/2004  Job:  [id number]

## 2010-10-05 ENCOUNTER — Ambulatory Visit (HOSPITAL_COMMUNITY)
Admission: RE | Admit: 2010-10-05 | Discharge: 2010-10-05 | Disposition: A | Discharge status: Home / Self Care | Payer: BC Managed Care – PPO | Source: Ambulatory Visit | Attending: Family Medicine | Admitting: Family Medicine

## 2010-10-05 ENCOUNTER — Other Ambulatory Visit (HOSPITAL_COMMUNITY): Payer: Self-pay | Admitting: Family Medicine

## 2010-10-05 DIAGNOSIS — M545 Low back pain, unspecified: Secondary | ICD-10-CM | POA: Insufficient documentation

## 2010-10-05 DIAGNOSIS — M25559 Pain in unspecified hip: Secondary | ICD-10-CM

## 2010-10-05 DIAGNOSIS — R262 Difficulty in walking, not elsewhere classified: Secondary | ICD-10-CM | POA: Insufficient documentation

## 2010-10-05 DIAGNOSIS — M5137 Other intervertebral disc degeneration, lumbosacral region: Secondary | ICD-10-CM | POA: Insufficient documentation

## 2010-10-05 DIAGNOSIS — M51379 Other intervertebral disc degeneration, lumbosacral region without mention of lumbar back pain or lower extremity pain: Secondary | ICD-10-CM | POA: Insufficient documentation

## 2010-10-05 DIAGNOSIS — M1610 Unilateral primary osteoarthritis, unspecified hip: Secondary | ICD-10-CM | POA: Insufficient documentation

## 2010-10-05 DIAGNOSIS — M169 Osteoarthritis of hip, unspecified: Secondary | ICD-10-CM | POA: Insufficient documentation

## 2011-08-03 ENCOUNTER — Ambulatory Visit (HOSPITAL_COMMUNITY)
Admission: RE | Admit: 2011-08-03 | Discharge: 2011-08-03 | Disposition: A | Discharge status: Home / Self Care | Payer: BC Managed Care – PPO | Source: Ambulatory Visit | Attending: Family Medicine | Admitting: Family Medicine

## 2011-08-03 ENCOUNTER — Other Ambulatory Visit (HOSPITAL_COMMUNITY): Payer: Self-pay | Admitting: Family Medicine

## 2011-08-03 DIAGNOSIS — Z1231 Encounter for screening mammogram for malignant neoplasm of breast: Secondary | ICD-10-CM | POA: Insufficient documentation

## 2011-08-03 DIAGNOSIS — Z139 Encounter for screening, unspecified: Secondary | ICD-10-CM

## 2012-11-13 ENCOUNTER — Other Ambulatory Visit (HOSPITAL_COMMUNITY): Payer: Self-pay | Admitting: Family Medicine

## 2012-11-13 DIAGNOSIS — Z139 Encounter for screening, unspecified: Secondary | ICD-10-CM

## 2012-11-17 ENCOUNTER — Ambulatory Visit (HOSPITAL_COMMUNITY)
Admission: RE | Admit: 2012-11-17 | Discharge: 2012-11-17 | Disposition: A | Discharge status: Home / Self Care | Payer: BC Managed Care – PPO | Source: Ambulatory Visit | Attending: Family Medicine | Admitting: Family Medicine

## 2012-11-17 DIAGNOSIS — Z139 Encounter for screening, unspecified: Secondary | ICD-10-CM

## 2012-11-17 DIAGNOSIS — Z1231 Encounter for screening mammogram for malignant neoplasm of breast: Secondary | ICD-10-CM | POA: Insufficient documentation

## 2014-01-04 ENCOUNTER — Other Ambulatory Visit (HOSPITAL_COMMUNITY): Payer: Self-pay | Admitting: Family Medicine

## 2014-01-04 DIAGNOSIS — Z1231 Encounter for screening mammogram for malignant neoplasm of breast: Secondary | ICD-10-CM

## 2014-01-11 ENCOUNTER — Ambulatory Visit (HOSPITAL_COMMUNITY)
Admission: RE | Admit: 2014-01-11 | Discharge: 2014-01-11 | Disposition: A | Discharge status: Home / Self Care | Payer: BC Managed Care – PPO | Source: Ambulatory Visit | Attending: Family Medicine | Admitting: Family Medicine

## 2014-01-11 DIAGNOSIS — Z1231 Encounter for screening mammogram for malignant neoplasm of breast: Secondary | ICD-10-CM | POA: Insufficient documentation

## 2015-10-25 ENCOUNTER — Other Ambulatory Visit (HOSPITAL_COMMUNITY): Payer: Self-pay | Admitting: Internal Medicine

## 2015-10-25 DIAGNOSIS — Z1231 Encounter for screening mammogram for malignant neoplasm of breast: Secondary | ICD-10-CM

## 2015-10-25 DIAGNOSIS — Z780 Asymptomatic menopausal state: Secondary | ICD-10-CM

## 2015-10-26 ENCOUNTER — Encounter (INDEPENDENT_AMBULATORY_CARE_PROVIDER_SITE_OTHER): Payer: Self-pay | Admitting: *Deleted

## 2015-11-07 ENCOUNTER — Ambulatory Visit (HOSPITAL_COMMUNITY): Payer: Self-pay

## 2015-11-07 ENCOUNTER — Observation Stay (HOSPITAL_COMMUNITY): Payer: Self-pay

## 2017-07-02 ENCOUNTER — Other Ambulatory Visit (HOSPITAL_COMMUNITY): Payer: Self-pay | Admitting: Family Medicine

## 2017-07-02 DIAGNOSIS — Z780 Asymptomatic menopausal state: Secondary | ICD-10-CM

## 2017-07-02 DIAGNOSIS — Z1231 Encounter for screening mammogram for malignant neoplasm of breast: Secondary | ICD-10-CM

## 2017-07-09 ENCOUNTER — Encounter: Payer: Self-pay | Admitting: Gastroenterology

## 2017-07-19 ENCOUNTER — Ambulatory Visit (HOSPITAL_COMMUNITY): Payer: Self-pay

## 2017-07-19 ENCOUNTER — Other Ambulatory Visit (HOSPITAL_COMMUNITY): Payer: Self-pay

## 2017-07-19 ENCOUNTER — Encounter (HOSPITAL_COMMUNITY): Payer: Self-pay

## 2017-07-22 ENCOUNTER — Ambulatory Visit (INDEPENDENT_AMBULATORY_CARE_PROVIDER_SITE_OTHER): Payer: Self-pay

## 2017-07-22 DIAGNOSIS — Z1211 Encounter for screening for malignant neoplasm of colon: Secondary | ICD-10-CM

## 2017-07-22 MED ORDER — PEG 3350-KCL-NA BICARB-NACL 420 G PO SOLR: 4000.0000 mL | ORAL | 0 refills | Status: DC

## 2017-07-22 NOTE — Progress Notes (Signed)
Gastroenterology Pre-Procedure Review  Request Date:07/22/17 Requesting Physician: [facility] (no previous tcs)  PATIENT REVIEW QUESTIONS: The patient responded to the following health history questions as indicated:    1. Diabetes Melitis: yes on glyburide and metformin 2. Joint replacements in the past 12 months: no 3. Major health problems in the past 3 months: no 4. Has an artificial valve or MVP: no 5. Has a defibrillator: no 6. Has been advised in past to take antibiotics in advance of a procedure like teeth cleaning: no 7. Family history of colon cancer: no  8. Alcohol Use: no 9. History of sleep apnea: no  10. History of coronary artery or other vascular stents placed within the last 12 months: no 11. History of any prior anesthesia complications: no    MEDICATIONS & ALLERGIES:    Patient reports the following regarding taking any blood thinners:   Plavix? no Aspirin? no Coumadin? no Brilinta? no Xarelto? no Eliquis? no Pradaxa? no Savaysa? no Effient? no  Patient confirms/reports the following medications:  Current Outpatient Medications  Medication Sig Dispense Refill  . glyBURIDE (DIABETA) 5 MG tablet daily.  0  . hydrochlorothiazide (HYDRODIURIL) 25 MG tablet TAKE ONE TABLET BY MOUTH DAILY FOR BLOOD PRESSURE  11  . lisinopril (PRINIVIL,ZESTRIL) 10 MG tablet TAKE 1 TABLET BY MOUTH ONCE A DAY FOR BLOOD PRESSURE  1  . metFORMIN (GLUCOPHAGE) 1000 MG tablet 2 (two) times daily.  11  . pravastatin (PRAVACHOL) 20 MG tablet   0   No current facility-administered medications for this visit.     Patient confirms/reports the following allergies:  No Known Allergies  No orders of the defined types were placed in this encounter.   AUTHORIZATION INFORMATION Primary Insurance:BCBS [place] ,  ID #: [id number] Pre-Cert / Auth required: no    SCHEDULE INFORMATION: Procedure has been scheduled as follows:  Date: 09/05/17, Time: 9:30  Location: [facility]  Dr.Fields  This Gastroenterology Pre-Precedure Review Form is being routed to the following provider(s): Walden Field NP

## 2017-07-22 NOTE — Patient Instructions (Signed)
[facility]   September 13, 1950 MRN: [id number]    Procedure Date: 09/05/17 Time to register: 8:30am Place to register: [facility] Procedure Time: 9:30am Scheduled provider: Barney Drain, MD  PREPARATION FOR COLONOSCOPY WITH TRI-LYTE SPLIT PREP  Please notify us immediately if you are diabetic, take iron supplements, or if you are on Coumadin or any other blood thinners.   Please hold the following medications: I will send you a letter with this information   You will need to purchase 1 fleet enema and 1 box of Bisacodyl 81m tablets.    1 DAY BEFORE PROCEDURE:  DATE: 09/04/17   DAY: Wednesday   clear liquids the entire day - NO SOLID FOOD.   Diabetic medications adjustments for today: see letter  At 2:00 pm:  Take 2 Bisacodyl tablets.   At 4:00pm:  Start drinking your solution. Make sure you mix well per instructions on the bottle. Try to drink 1 (one) 8 ounce glass every 10-15 minutes until you have consumed HALF the jug. You should complete by 6:00pm.You must keep the left over solution refrigerated until completed next day.  Continue clear liquids. You must drink plenty of clear liquids to prevent dehyration and kidney failure. Nothing to eat or drink after midnight.  EXCEPTION: If you take medications for your heart, blood pressure or breathing, you may take these medications with a small amount of clear liquid.    DAY OF PROCEDURE:   DATE: 09/05/17  DAY: Thursday  Diabetic medications adjustments for today: see letter  Five hours before your procedure time @ 4:30am:  Finish remaining amout of bowel prep, drinking 1 (one) 8 ounce glass every 10-15 minutes until complete. You have two hours to consume remaining prep.   Three hours before your procedure time _0 :30am:  Nothing by mouth.   At least one hour before going to the hospital:  Give yourself one Fleet enema. You may take your morning medications with sip of water unless we have instructed otherwise.       Please see below for Dietary Information.  CLEAR LIQUIDS INCLUDE:  Water Jello (NOT red in color)   Ice Popsicles (NOT red in color)   Tea (sugar ok, no milk/cream) Powdered fruit flavored drinks  Coffee (sugar ok, no milk/cream) Gatorade/ Lemonade/ Kool-Aid  (NOT red in color)   Juice: apple, white grape, white cranberry Soft drinks  Clear bullion, consomme, broth (fat free beef/chicken/vegetable)  Carbonated beverages (any kind)  Strained chicken noodle soup Hard Candy   Remember: Clear liquids are liquids that will allow you to see your fingers on the other side of a clear glass. Be sure liquids are NOT red in color, and not cloudy, but CLEAR.  DO NOT EAT OR DRINK ANY OF THE FOLLOWING:  Dairy products of any kind   Cranberry juice Tomato juice / V8 juice   Grapefruit juice Orange juice     Red grape juice  Do not eat any solid foods, including such foods as: cereal, oatmeal, yogurt, fruits, vegetables, creamed soups, eggs, bread, crackers, pureed foods in a blender, etc.   HELPFUL HINTS FOR DRINKING PREP SOLUTION:   Make sure prep is extremely cold. Mix and refrigerate the the morning of the prep. You may also put in the freezer.   You may try mixing some Crystal Light or Country Time Lemonade if you prefer. Mix in small amounts; add more if necessary.  Try drinking through a straw  Rinse mouth with water or a mouthwash  between glasses, to remove after-taste.  Try sipping on a cold beverage /ice/ popsicles between glasses of prep.  Place a piece of sugar-free hard candy in mouth between glasses.  If you become nauseated, try consuming smaller amounts, or stretch out the time between glasses. Stop for 30-60 minutes, then slowly start back drinking.        OTHER INSTRUCTIONS  You will need a responsible adult at least 65 years of age to accompany you and drive you home. This person must remain in the waiting room during your procedure. The hospital will cancel  your procedure if you do not have a responsible adult with you.   1. Wear loose fitting clothing that is easily removed. 2. Leave jewelry and other valuables at home.  3. Remove all body piercing jewelry and leave at home. 4. Total time from sign-in until discharge is approximately 2-3 hours. 5. You should go home directly after your procedure and rest. You can resume normal activities the day after your procedure. 6. The day of your procedure you should not:  Drive  Make legal decisions  Operate machinery  Drink alcohol  Return to work   You may call the office (Dept: [phone]) before 5:00pm, or page the doctor on call [phone]) after 5:00pm, for further instructions, if necessary.   Insurance Information YOU WILL NEED TO CHECK WITH YOUR INSURANCE COMPANY FOR THE BENEFITS OF COVERAGE YOU HAVE FOR THIS PROCEDURE.  UNFORTUNATELY, NOT ALL INSURANCE COMPANIES HAVE BENEFITS TO COVER ALL OR PART OF THESE TYPES OF PROCEDURES.  IT IS YOUR RESPONSIBILITY TO CHECK YOUR BENEFITS, HOWEVER, WE WILL BE GLAD TO ASSIST YOU WITH ANY CODES YOUR INSURANCE COMPANY MAY NEED.    PLEASE NOTE THAT MOST INSURANCE COMPANIES WILL NOT COVER A SCREENING COLONOSCOPY FOR PEOPLE UNDER THE AGE OF 50  IF YOU HAVE BCBS INSURANCE, YOU MAY HAVE BENEFITS FOR A SCREENING COLONOSCOPY BUT IF POLYPS ARE FOUND THE DIAGNOSIS WILL CHANGE AND THEN YOU MAY HAVE A DEDUCTIBLE THAT WILL NEED TO BE MET. SO PLEASE MAKE SURE YOU CHECK YOUR BENEFITS FOR A SCREENING COLONOSCOPY AS WELL AS A DIAGNOSTIC COLONOSCOPY.

## 2017-07-23 NOTE — Progress Notes (Signed)
Ok to schedule.  DM meds: half night before and none morning of.  On prep day: Check CBG ac and hs as well as if the patient feels like their blood sugar is off. Can use soda, juice (that's in CLEAR LIQUIDS) as needed for any low blood sugar.  Check CBG on arrival to endo unit. 

## 2017-07-24 NOTE — Progress Notes (Signed)
Letter mailed to the pt with DM medication instructions.  

## 2017-09-05 ENCOUNTER — Encounter (HOSPITAL_COMMUNITY)
Admission: RE | Disposition: A | Discharge status: Home / Self Care | Payer: Self-pay | Source: Ambulatory Visit | Attending: Gastroenterology

## 2017-09-05 ENCOUNTER — Ambulatory Visit (HOSPITAL_COMMUNITY)
Admission: RE | Admit: 2017-09-05 | Discharge: 2017-09-05 | Disposition: A | Discharge status: Home / Self Care | Payer: BLUE CROSS/BLUE SHIELD | Source: Ambulatory Visit | Attending: Gastroenterology | Admitting: Gastroenterology

## 2017-09-05 ENCOUNTER — Encounter (HOSPITAL_COMMUNITY): Payer: Self-pay | Admitting: *Deleted

## 2017-09-05 ENCOUNTER — Other Ambulatory Visit: Payer: Self-pay

## 2017-09-05 DIAGNOSIS — E119 Type 2 diabetes mellitus without complications: Secondary | ICD-10-CM | POA: Insufficient documentation

## 2017-09-05 DIAGNOSIS — Z79899 Other long term (current) drug therapy: Secondary | ICD-10-CM | POA: Diagnosis not present

## 2017-09-05 DIAGNOSIS — E7800 Pure hypercholesterolemia, unspecified: Secondary | ICD-10-CM | POA: Insufficient documentation

## 2017-09-05 DIAGNOSIS — K5730 Diverticulosis of large intestine without perforation or abscess without bleeding: Secondary | ICD-10-CM | POA: Diagnosis not present

## 2017-09-05 DIAGNOSIS — Z7984 Long term (current) use of oral hypoglycemic drugs: Secondary | ICD-10-CM | POA: Insufficient documentation

## 2017-09-05 DIAGNOSIS — K644 Residual hemorrhoidal skin tags: Secondary | ICD-10-CM | POA: Diagnosis not present

## 2017-09-05 DIAGNOSIS — Z1211 Encounter for screening for malignant neoplasm of colon: Secondary | ICD-10-CM | POA: Diagnosis not present

## 2017-09-05 DIAGNOSIS — K648 Other hemorrhoids: Secondary | ICD-10-CM | POA: Insufficient documentation

## 2017-09-05 DIAGNOSIS — D122 Benign neoplasm of ascending colon: Secondary | ICD-10-CM | POA: Diagnosis not present

## 2017-09-05 DIAGNOSIS — I10 Essential (primary) hypertension: Secondary | ICD-10-CM | POA: Diagnosis not present

## 2017-09-05 DIAGNOSIS — D120 Benign neoplasm of cecum: Secondary | ICD-10-CM

## 2017-09-05 DIAGNOSIS — Q438 Other specified congenital malformations of intestine: Secondary | ICD-10-CM | POA: Diagnosis not present

## 2017-09-05 HISTORY — DX: Essential (primary) hypertension: I10

## 2017-09-05 HISTORY — PX: COLONOSCOPY: SHX5424

## 2017-09-05 HISTORY — DX: Pure hypercholesterolemia, unspecified: E78.00

## 2017-09-05 HISTORY — DX: Type 2 diabetes mellitus without complications: E11.9

## 2017-09-05 LAB — GLUCOSE, CAPILLARY: GLUCOSE-CAPILLARY: 142 mg/dL — AB (ref 65–99)

## 2017-09-05 SURGERY — COLONOSCOPY
Anesthesia: Moderate Sedation

## 2017-09-05 MED ORDER — MIDAZOLAM HCL 5 MG/5ML IJ SOLN
INTRAMUSCULAR | Status: DC | PRN
  Administered 2017-09-05: 2 mg via INTRAVENOUS
  Administered 2017-09-05 (×2): 1 mg via INTRAVENOUS

## 2017-09-05 MED ORDER — MIDAZOLAM HCL 5 MG/5ML IJ SOLN
INTRAMUSCULAR | Status: AC
  Filled 2017-09-05: qty 10

## 2017-09-05 MED ORDER — MEPERIDINE HCL 100 MG/ML IJ SOLN
INTRAMUSCULAR | Status: AC
  Filled 2017-09-05: qty 2

## 2017-09-05 MED ORDER — MEPERIDINE HCL 100 MG/ML IJ SOLN
INTRAMUSCULAR | Status: DC | PRN
  Administered 2017-09-05 (×2): 25 mg

## 2017-09-05 MED ORDER — STERILE WATER FOR IRRIGATION IR SOLN
Status: DC | PRN
  Administered 2017-09-05: 10:00:00

## 2017-09-05 MED ORDER — SODIUM CHLORIDE 0.9 % IV SOLN
INTRAVENOUS | Status: DC
  Administered 2017-09-05: 09:00:00 via INTRAVENOUS

## 2017-09-05 NOTE — Op Note (Signed)
[facility] Patient Name: Samantha Werner Procedure Date: 09/05/2017 8:58 AM MRN: [id number] Date of Birth: Nov 23, 1950 Attending MD: Barney Drain MD, MD CSN: [id number] Age: 67 Admit Type: Outpatient Procedure:                Colonoscopy WITH COLD FORCEPS BIOPSY Indications:              Screening for colorectal malignant neoplasm Providers:                Barney Drain MD, MD, Janeece Riggers, RN, Nelma Rothman,                            Technician Referring MD:             Teodora Medici. Kim Medicines:                Meperidine 50 mg IV, Midazolam 4 mg IV Complications:            No immediate complications. Estimated Blood Loss:     Estimated blood loss was minimal. Procedure:                Pre-Anesthesia Assessment:                           - Prior to the procedure, a History and Physical                            was performed, and patient medications and                            allergies were reviewed. The patient's tolerance of                            previous anesthesia was also reviewed. The risks                            and benefits of the procedure and the sedation                            options and risks were discussed with the patient.                            All questions were answered, and informed consent                            was obtained. Prior Anticoagulants: The patient has                            taken no previous anticoagulant or antiplatelet                            agents. ASA Grade Assessment: II - A patient with                            mild systemic disease. After reviewing the risks  and benefits, the patient was deemed in                            satisfactory condition to undergo the procedure.                            After obtaining informed consent, the colonoscope                            was passed under direct vision. Throughout the                            procedure, the patient's blood  pressure, pulse, and                            oxygen saturations were monitored continuously. The                            EC-[id number] ([id number]) scope was introduced through                            the anus and advanced to the the cecum, identified                            by appendiceal orifice and ileocecal valve. The                            colonoscopy was somewhat difficult due to a                            tortuous colon. Successful completion of the                            procedure was aided by increasing the dose of                            sedation medication, straightening and shortening                            the scope to obtain bowel loop reduction and                            COLOWRAP. The patient tolerated the procedure                            fairly well. The quality of the bowel preparation                            was good. The ileocecal valve, appendiceal orifice,                            and rectum were photographed. Scope In: 9:44:27 AM Scope Out: 10:07:18 AM Scope Withdrawal Time: 0 hours 14 minutes 3 seconds  Total Procedure  Duration: 0 hours 22 minutes 51 seconds  Findings:      Two sessile polyps were found in the ascending colon and cecum. The       polyps were 2 to 3 mm in size. These polyps were removed with a cold       biopsy forceps. Resection and retrieval were complete.      Multiple small and large-mouthed diverticula were found in the       recto-sigmoid colon, sigmoid colon and hepatic flexure.      External and internal hemorrhoids were found.      The recto-sigmoid colon, sigmoid colon and descending colon were       moderately redundant. Impression:               - Two 2 to 3 mm polyps in the ascending colon and                            in the cecum, removed with a cold biopsy forceps.                            Resected and retrieved.                           - MILD Diverticulosis in the recto-sigmoid colon,                             in the sigmoid colon and at the hepatic flexure.                           - SMALL External and internal hemorrhoids.                           - REDUNDANT LEFT COLON Moderate Sedation:      Moderate (conscious) sedation was administered by the endoscopy nurse       and supervised by the endoscopist. The following parameters were       monitored: oxygen saturation, heart rate, blood pressure, and response       to care. Total physician intraservice time was 37 minutes. Recommendation:           - Repeat colonoscopy in 5-10 years for surveillance.                           - High fiber diet.                           - Continue present medications.                           - Await pathology results.                           - Patient has a contact number available for                            emergencies. The signs and symptoms of potential  delayed complications were discussed with the                            patient. Return to normal activities tomorrow.                            Written discharge instructions were provided to the                            patient. Procedure Code(s):        --- Professional ---                           [phone], Colonoscopy, flexible; with biopsy, single                            or multiple                           G0500, Moderate sedation services provided by the                            same physician or other qualified health care                            professional performing a gastrointestinal                            endoscopic service that sedation supports,                            requiring the presence of an independent trained                            observer to assist in the monitoring of the                            patient's level of consciousness and physiological                            status; initial 15 minutes of intra-service time;                             patient age 14 years or older (additional time may                            be reported with [phone], as appropriate)                           [phone], Moderate sedation services provided by the                            same physician or other qualified health care  professional performing the diagnostic or                            therapeutic service that the sedation supports,                            requiring the presence of an independent trained                            observer to assist in the monitoring of the                            patient's level of consciousness and physiological                            status; each additional 15 minutes intraservice                            time (List separately in addition to code for                            primary service) Diagnosis Code(s):        --- Professional ---                           Z12.11, Encounter for screening for malignant                            neoplasm of colon                           D12.2, Benign neoplasm of ascending colon                           D12.0, Benign neoplasm of cecum                           K64.8, Other hemorrhoids                           K57.30, Diverticulosis of large intestine without                            perforation or abscess without bleeding CPT copyright 2017 American Medical Association. All rights reserved. The codes documented in this report are preliminary and upon coder review may  be revised to meet current compliance requirements. Barney Drain, MD Barney Drain MD, MD 09/05/2017 10:33:03 AM This report has been signed electronically. Number of Addenda: 0

## 2017-09-05 NOTE — H&P (Signed)
Primary Care Physician:  Jani Gravel, MD Primary Gastroenterologist:  Dr. Oneida Alar  Pre-Procedure History & Physical: HPI:  Samantha Werner is a 67 y.o. female here for [place].  Past Medical History:  Diagnosis Date  . Diabetes mellitus without complication ([place])   . Hypercholesteremia   . Hypertension     Past Surgical History:  Procedure Laterality Date  . DILATION AND CURETTAGE OF UTERUS      Prior to Admission medications   Medication Sig Start Date End Date Taking? Authorizing Provider  glyBURIDE (DIABETA) 5 MG tablet Take 5 mg by mouth daily.  07/20/17  Yes [provider]  hydrochlorothiazide (HYDRODIURIL) 25 MG tablet TAKE ONE TABLET BY MOUTH DAILY FOR BLOOD PRESSURE 06/05/17  Yes [provider]  lisinopril (PRINIVIL,ZESTRIL) 10 MG tablet TAKE 1 TABLET BY MOUTH ONCE A DAY FOR BLOOD PRESSURE 07/08/17  Yes [provider]  metFORMIN (GLUCOPHAGE) 1000 MG tablet Take 1,000 mg by mouth 2 (two) times daily.  07/18/17  Yes [provider]  polyethylene glycol-electrolytes (TRILYTE) 420 g solution Take 4,000 mLs by mouth as directed. 07/22/17  Yes Carlis Stable, NP  pravastatin (PRAVACHOL) 20 MG tablet Take 20 mg by mouth at bedtime.  07/21/17  Yes [provider]  Menthol-Methyl Salicylate (MUSCLE RUB) 10-15 % CREA Apply 1 application topically as needed for muscle pain.    [provider]    Allergies as of 07/22/2017  . (No Known Allergies)    Family History  Problem Relation Age of Onset  . Colon cancer Neg Hx   . Colon polyps Neg Hx     Social History   Socioeconomic History  . Marital status: Married    Spouse name: Not on file  . Number of children: Not on file  . Years of education: Not on file  . Highest education level: Not on file  Occupational History  . Not on file  Social Needs  . Financial resource strain: Not on file  . Food insecurity:    Worry: Not on file    Inability: Not on file  .  Transportation needs:    Medical: Not on file    Non-medical: Not on file  Tobacco Use  . Smoking status: Never Smoker  . Smokeless tobacco: Never Used  Substance and Sexual Activity  . Alcohol use: Never    Frequency: Never  . Drug use: Never  . Sexual activity: Not on file  Lifestyle  . Physical activity:    Days per week: Not on file    Minutes per session: Not on file  . Stress: Not on file  Relationships  . Social connections:    Talks on phone: Not on file    Gets together: Not on file    Attends religious service: Not on file    Active member of club or organization: Not on file    Attends meetings of clubs or organizations: Not on file    Relationship status: Not on file  . Intimate partner violence:    Fear of current or ex partner: Not on file    Emotionally abused: Not on file    Physically abused: Not on file    Forced sexual activity: Not on file  Other Topics Concern  . Not on file  Social History Narrative  . Not on file    Review of Systems: See HPI, otherwise negative ROS   Physical Exam: BP (!) 185/90   Pulse 81  Temp 98.9 F (37.2 C)   Resp 17   Ht 5\' 3"  (1.6 m)   Wt 132 lb (59.9 kg)   SpO2 98%   BMI 23.38 kg/m  General:   Alert,  pleasant and cooperative in NAD Head:  Normocephalic and atraumatic. Neck:  Supple; Lungs:  Clear throughout to auscultation.    Heart:  Regular rate and rhythm. Abdomen:  Soft, nontender and nondistended. Normal bowel sounds, without guarding, and without rebound.   Neurologic:  Alert and  oriented x4;  grossly normal neurologically.  Impression/Plan:     SCREENING  Plan:  1. TCS TODAY. DISCUSSED PROCEDURE, BENEFITS, & RISKS: < 1% chance of medication reaction, bleeding, perforation, or rupture of spleen/liver.

## 2017-09-05 NOTE — Discharge Instructions (Signed)
You have small internal hemorrhoids and diverticulosis IN YOUR LEFT AND RIGHT COLON. YOU HAD TWO POLYP REMOVED.    DRINK WATER TO KEEP YOUR URINE LIGHT YELLOW.  FOLLOW A HIGH FIBER DIET. AVOID ITEMS THAT CAUSE BLOATING. See info below.  YOUR BIOPSY RESULTS WILL BE AVAILABLE IN 7 DAYS.  USE PREPARATION H FOUR TIMES  A DAY IF NEEDED TO RELIEVE RECTAL PAIN/PRESSURE/BLEEDING.  Next colonoscopy in 5-10 years.  Colonoscopy Care After Read the instructions outlined below and refer to this sheet in the next week. These discharge instructions provide you with general information on caring for yourself after you leave the hospital. While your treatment has been planned according to the most current medical practices available, unavoidable complications occasionally occur. If you have any problems or questions after discharge, call DR. Kishia Shackett, [phone].  ACTIVITY  You may resume your regular activity, but move at a slower pace for the next 24 hours.   Take frequent rest periods for the next 24 hours.   Walking will help get rid of the air and reduce the bloated feeling in your belly (abdomen).   No driving for 24 hours (because of the medicine (anesthesia) used during the test).   You may shower.   Do not sign any important legal documents or operate any machinery for 24 hours (because of the anesthesia used during the test).    NUTRITION  Drink plenty of fluids.   You may resume your normal diet as instructed by your doctor.   Begin with a light meal and progress to your normal diet. Heavy or fried foods are harder to digest and may make you feel sick to your stomach (nauseated).   Avoid alcoholic beverages for 24 hours or as instructed.    MEDICATIONS  You may resume your normal medications.   WHAT YOU CAN EXPECT TODAY  Some feelings of bloating in the abdomen.   Passage of more gas than usual.   Spotting of blood in your stool or on the toilet paper  .  IF YOU HAD  POLYPS REMOVED DURING THE COLONOSCOPY:  Eat a soft diet IF YOU HAVE NAUSEA, BLOATING, ABDOMINAL PAIN, OR VOMITING.    FINDING OUT THE RESULTS OF YOUR TEST Not all test results are available during your visit. DR. Oneida Alar WILL CALL YOU WITHIN 14 DAYS OF YOUR PROCEDUE WITH YOUR RESULTS. Do not assume everything is normal if you have not heard from DR. Doyl Bitting, CALL HER OFFICE AT [phone].  SEEK IMMEDIATE MEDICAL ATTENTION AND CALL THE OFFICE: [phone] IF:  You have more than a spotting of blood in your stool.   Your belly is swollen (abdominal distention).   You are nauseated or vomiting.   You have a temperature over 101F.   You have abdominal pain or discomfort that is severe or gets worse throughout the day.  High-Fiber Diet A high-fiber diet changes your normal diet to include more whole grains, legumes, fruits, and vegetables. Changes in the diet involve replacing refined carbohydrates with unrefined foods. The calorie level of the diet is essentially unchanged. The Dietary Reference Intake (recommended amount) for adult males is 38 grams per day. For adult females, it is 25 grams per day. Pregnant and lactating women should consume 28 grams of fiber per day. Fiber is the intact part of a plant that is not broken down during digestion. Functional fiber is fiber that has been isolated from the plant to provide a beneficial effect in the body.  PURPOSE  Increase stool  bulk.   Ease and regulate bowel movements.   Lower cholesterol.   REDUCE RISK OF COLON CANCER  INDICATIONS THAT YOU NEED MORE FIBER  Constipation and hemorrhoids.   Uncomplicated diverticulosis (intestine condition) and irritable bowel syndrome.   Weight management.   As a protective measure against hardening of the arteries (atherosclerosis), diabetes, and cancer.   GUIDELINES FOR INCREASING FIBER IN THE DIET  Start adding fiber to the diet slowly. A gradual increase of about 5 more grams (2 slices  of whole-wheat bread, 2 servings of most fruits or vegetables, or 1 bowl of high-fiber cereal) per day is best. Too rapid an increase in fiber may result in constipation, flatulence, and bloating.   Drink enough water and fluids to keep your urine clear or pale yellow. Water, juice, or caffeine-free drinks are recommended. Not drinking enough fluid may cause constipation.   Eat a variety of high-fiber foods rather than one type of fiber.   Try to increase your intake of fiber through using high-fiber foods rather than fiber pills or supplements that contain small amounts of fiber.   The goal is to change the types of food eaten. Do not supplement your present diet with high-fiber foods, but replace foods in your present diet.   INCLUDE A VARIETY OF FIBER SOURCES  Replace refined and processed grains with whole grains, canned fruits with fresh fruits, and incorporate other fiber sources. White rice, white breads, and most bakery goods contain little or no fiber.   Brown whole-grain rice, buckwheat oats, and many fruits and vegetables are all good sources of fiber. These include: broccoli, Brussels sprouts, cabbage, cauliflower, beets, sweet potatoes, white potatoes (skin on), carrots, tomatoes, eggplant, squash, berries, fresh fruits, and dried fruits.   Cereals appear to be the richest source of fiber. Cereal fiber is found in whole grains and bran. Bran is the fiber-rich outer coat of cereal grain, which is largely removed in refining. In whole-grain cereals, the bran remains. In breakfast cereals, the largest amount of fiber is found in those with "bran" in their names. The fiber content is sometimes indicated on the label.   You may need to include additional fruits and vegetables each day.   In baking, for 1 cup white flour, you may use the following substitutions:   1 cup whole-wheat flour minus 2 tablespoons.   1/2 cup white flour plus 1/2 cup whole-wheat flour.   Polyps, Colon  A  polyp is extra tissue that grows inside your body. Colon polyps grow in the large intestine. The large intestine, also called the colon, is part of your digestive system. It is a long, hollow tube at the end of your digestive tract where your body makes and stores stool. Most polyps are not dangerous. They are benign. This means they are not cancerous. But over time, some types of polyps can turn into cancer. Polyps that are smaller than a pea are usually not harmful. But larger polyps could someday become or may already be cancerous. To be safe, doctors remove all polyps and test them.   PREVENTION There is not one sure way to prevent polyps. You might be able to lower your risk of getting them if you:  Eat more fruits and vegetables and less fatty food.   Do not smoke.   Avoid alcohol.   Exercise every day.   Lose weight if you are overweight.   Eating more calcium and folate can also lower your risk of getting polyps. Some foods  that are rich in calcium are milk, cheese, and broccoli. Some foods that are rich in folate are chickpeas, kidney beans, and spinach.    Diverticulosis Diverticulosis is a common condition that develops when small pouches (diverticula) form in the wall of the colon. The risk of diverticulosis increases with age. It happens more often in people who eat a low-fiber diet. Most individuals with diverticulosis have no symptoms. Those individuals with symptoms usually experience belly (abdominal) pain, constipation, or loose stools (diarrhea).  HOME CARE INSTRUCTIONS  Increase the amount of fiber in your diet as directed by your caregiver or dietician. This may reduce symptoms of diverticulosis.   Drink at least 6 to 8 glasses of water each day to prevent constipation.   Try not to strain when you have a bowel movement.   Avoiding nuts and seeds to prevent complications is NOT NECESSARY.   FOODS HAVING HIGH FIBER CONTENT INCLUDE:  Fruits. Apple, peach, pear,  tangerine, raisins, prunes.   Vegetables. Brussels sprouts, asparagus, broccoli, cabbage, carrot, cauliflower, romaine lettuce, spinach, summer squash, tomato, winter squash, zucchini.   Starchy Vegetables. Baked beans, kidney beans, lima beans, split peas, lentils, potatoes (with skin).   Grains. Whole wheat bread, brown rice, bran flake cereal, plain oatmeal, white rice, shredded wheat, bran muffins.   SEEK IMMEDIATE MEDICAL CARE IF:  You develop increasing pain or severe bloating.   You have an oral temperature above 101F.   You develop vomiting or bowel movements that are bloody or black.   Hemorrhoids Hemorrhoids are dilated (enlarged) veins around the rectum. Sometimes clots will form in the veins. This makes them swollen and painful. These are called thrombosed hemorrhoids. Causes of hemorrhoids include:  Constipation.   Straining to have a bowel movement.   HEAVY LIFTING   HOME CARE INSTRUCTIONS  Eat a well balanced diet and drink 6 to 8 glasses of water every day to avoid constipation. You may also use a bulk laxative.   Avoid straining to have bowel movements.   Keep anal area dry and clean.   Do not use a donut shaped pillow or sit on the toilet for long periods. This increases blood pooling and pain.   Move your bowels when your body has the urge; this will require less straining and will decrease pain and pressure.

## 2017-09-10 ENCOUNTER — Encounter (HOSPITAL_COMMUNITY): Payer: Self-pay | Admitting: Gastroenterology

## 2017-09-18 ENCOUNTER — Telehealth: Payer: Self-pay | Admitting: Gastroenterology

## 2017-09-18 NOTE — Telephone Encounter (Signed)
Please call pt. She had TWO simple adenomas removed.   DRINK WATER TO KEEP YOUR URINE LIGHT YELLOW.  FOLLOW A HIGH FIBER DIET. AVOID ITEMS THAT CAUSE BLOATING.   USE PREPARATION H FOUR TIMES  A DAY IF NEEDED TO RELIEVE RECTAL PAIN/PRESSURE/BLEEDING.  Next colonoscopy between 2024 and 2026.

## 2017-09-18 NOTE — Telephone Encounter (Signed)
Called, many rings and no answer. Mailing a letter to call for results.

## 2017-09-25 NOTE — Telephone Encounter (Signed)
Pt called and is aware of results.  

## 2019-06-20 ENCOUNTER — Ambulatory Visit: Payer: Self-pay | Attending: Internal Medicine

## 2019-06-20 DIAGNOSIS — Z23 Encounter for immunization: Secondary | ICD-10-CM

## 2019-06-20 NOTE — Progress Notes (Signed)
   Covid-19 Vaccination Clinic  Name:  Samantha Werner    MRN: [id number] DOB: 1950/04/11  06/20/2019  [name] was observed post Covid-19 immunization for 15 minutes without incident. She was provided with Vaccine Information Sheet and instruction to access the V-Safe system.   [name] was instructed to call 911 with any severe reactions post vaccine: Marland Kitchen Difficulty breathing  . Swelling of face and throat  . A fast heartbeat  . A bad rash all over body  . Dizziness and weakness   Immunizations Administered    Name Date Dose VIS Date Route   Moderna COVID-19 Vaccine 06/20/2019 12:15 PM 0.5 mL 03/10/2019 Intramuscular   Manufacturer: Moderna   Lot: [id number]   [id number]

## 2019-07-22 ENCOUNTER — Ambulatory Visit: Payer: Self-pay | Attending: Internal Medicine

## 2019-07-22 DIAGNOSIS — Z23 Encounter for immunization: Secondary | ICD-10-CM

## 2019-07-22 NOTE — Progress Notes (Signed)
   Covid-19 Vaccination Clinic  Name:  Samantha Werner    MRN: [id number] DOB: 07-01-1950  07/22/2019  [name] was observed post Covid-19 immunization for 15 minutes without incident. She was provided with Vaccine Information Sheet and instruction to access the V-Safe system.   [name] was instructed to call 911 with any severe reactions post vaccine: Marland Kitchen Difficulty breathing  . Swelling of face and throat  . A fast heartbeat  . A bad rash all over body  . Dizziness and weakness   Immunizations Administered    Name Date Dose VIS Date Route   Moderna COVID-19 Vaccine 07/22/2019 12:02 PM 0.5 mL 03/10/2019 Intramuscular   Manufacturer: Moderna   Lot: [id number]   [place]:[id number]

## 2019-12-16 ENCOUNTER — Other Ambulatory Visit (HOSPITAL_COMMUNITY): Payer: Self-pay | Admitting: Adult Health

## 2019-12-16 DIAGNOSIS — Z1231 Encounter for screening mammogram for malignant neoplasm of breast: Secondary | ICD-10-CM

## 2019-12-30 ENCOUNTER — Other Ambulatory Visit: Payer: Self-pay

## 2019-12-30 ENCOUNTER — Ambulatory Visit (HOSPITAL_COMMUNITY)
Admission: RE | Admit: 2019-12-30 | Discharge: 2019-12-30 | Disposition: A | Discharge status: Home / Self Care | Payer: BC Managed Care – PPO | Source: Ambulatory Visit | Attending: Adult Health | Admitting: Adult Health

## 2019-12-30 DIAGNOSIS — Z1231 Encounter for screening mammogram for malignant neoplasm of breast: Secondary | ICD-10-CM | POA: Diagnosis not present

## 2021-03-12 IMAGING — MG DIGITAL SCREENING BILAT W/ TOMO W/ CAD
8 series · 9 of 24 positions shown · non-contrast
Comparison: Previous exam(s).

CLINICAL DATA: Screening.

EXAM:
DIGITAL SCREENING BILATERAL MAMMOGRAM WITH TOMO AND CAD

[L CC synth-2D]
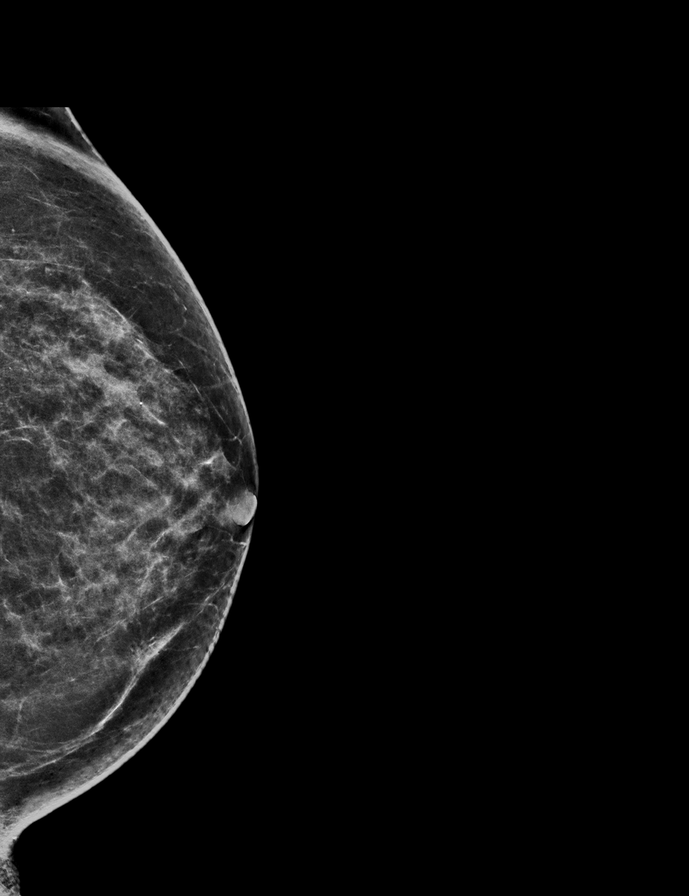

[R CC synth-2D]
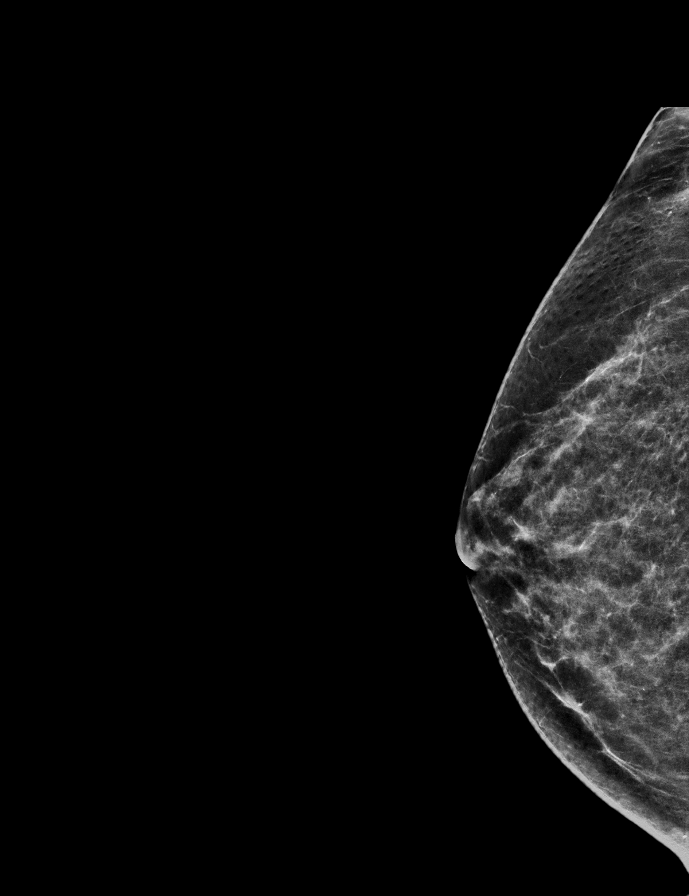

[R MLO synth-2D]
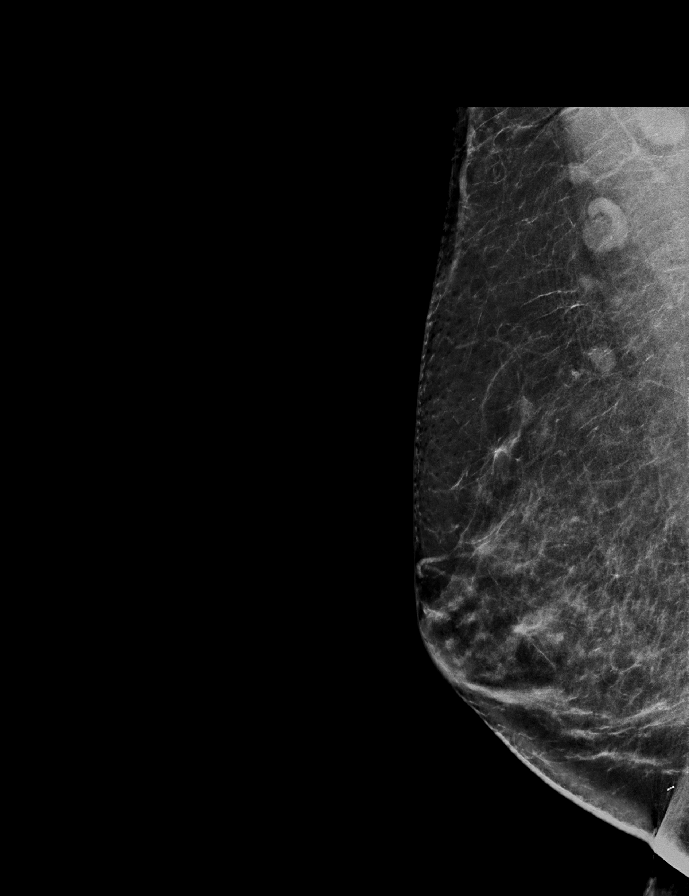

[L MLO synth-2D]
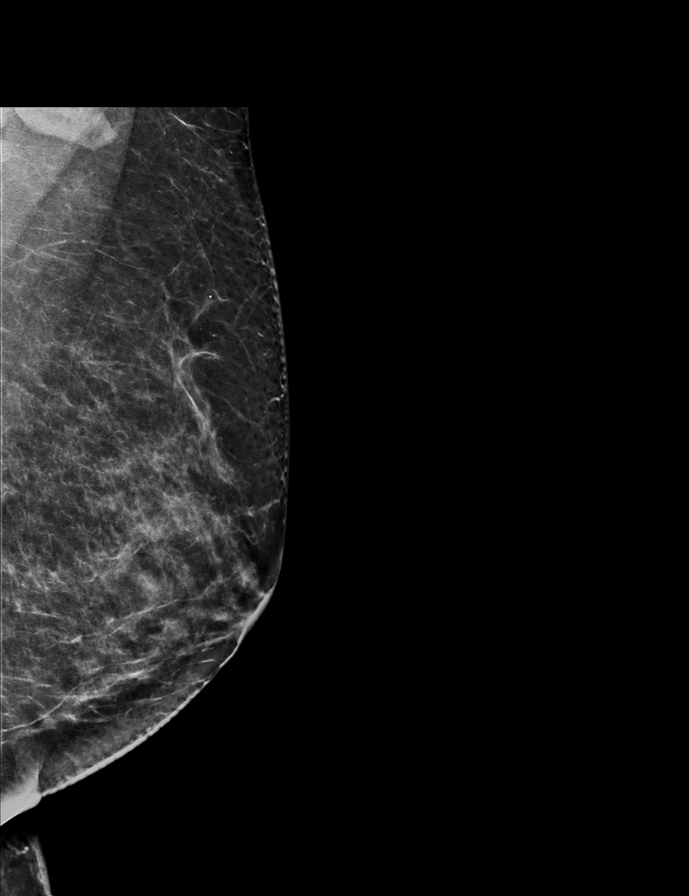

[L CC tomo · 2 of 64 frames shown]
[frame 21/64]
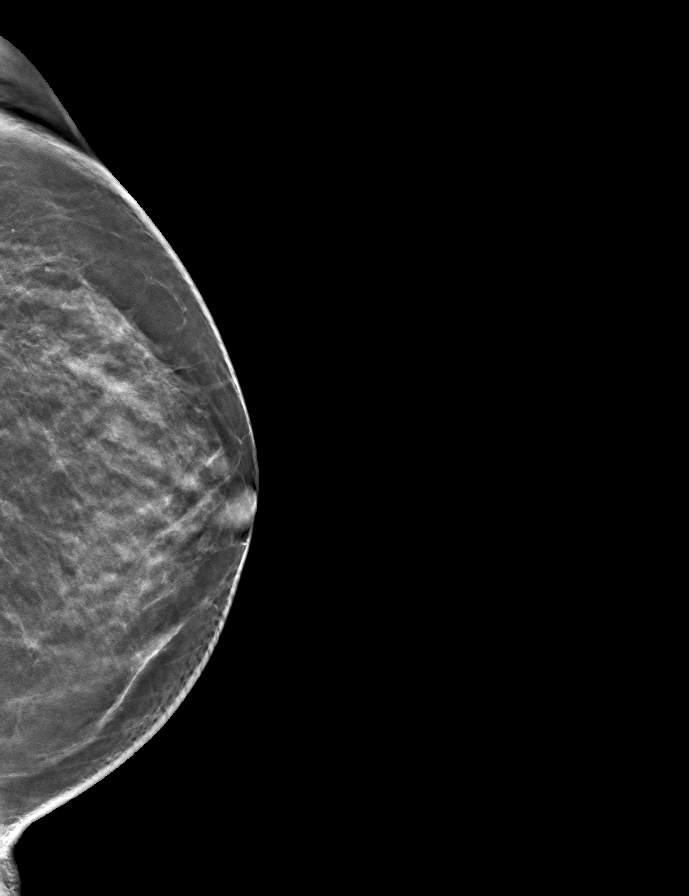
[frame 33/64]
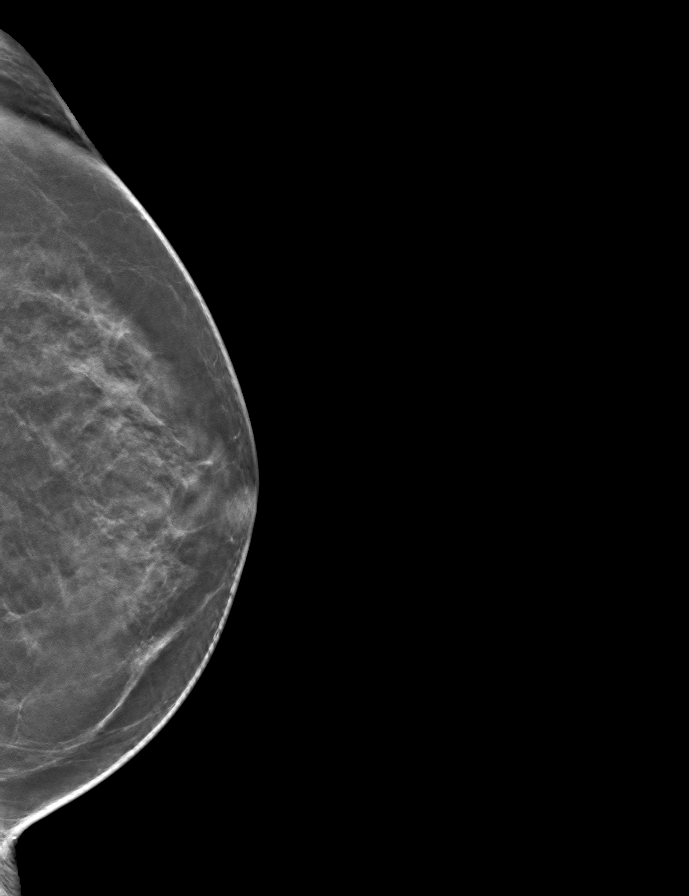

[L MLO tomo · tomo slice 35/68.0]
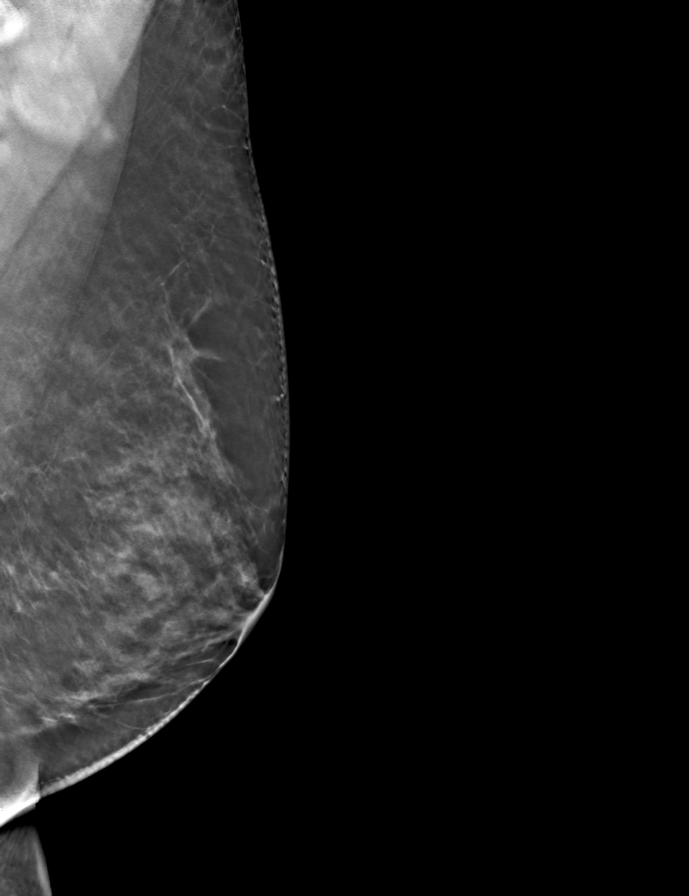

[R MLO tomo · tomo slice 36/71.0]
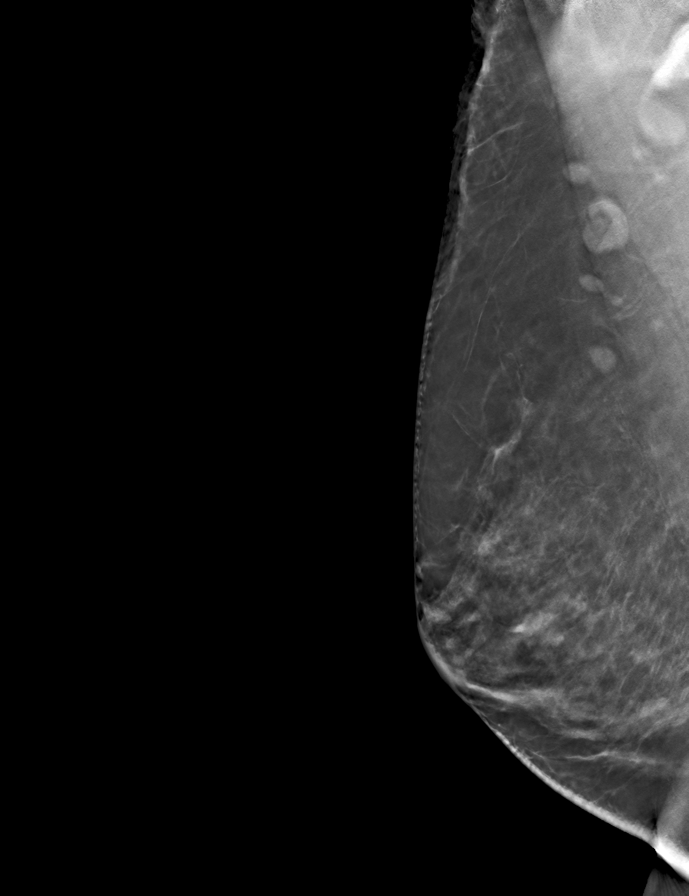

[R CC tomo · tomo slice 31/62.0]
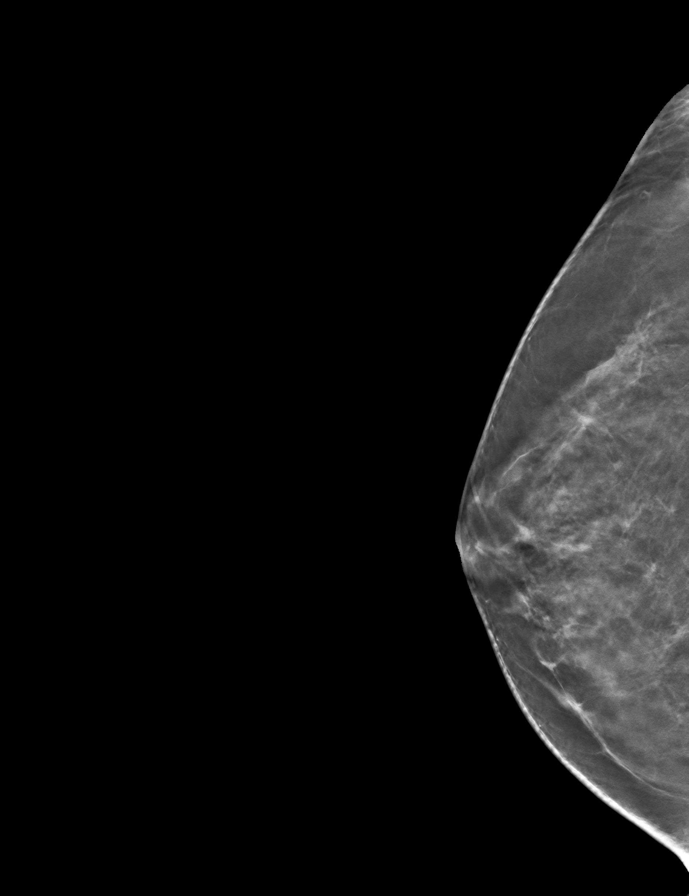

[9 of 24 positions shown; findings below may reference images not displayed]

ACR Breast Density Category c: The breast tissue is heterogeneously
dense, which may obscure small masses.
FINDINGS: There are no findings suspicious for malignancy. Images were
processed with CAD.
IMPRESSION: No mammographic evidence of malignancy. A result letter of this
screening mammogram will be mailed directly to the patient.

RECOMMENDATION:
Screening mammogram in one year. (Code:[id number])

BI-RADS CATEGORY  1: Negative.

## 2022-01-25 ENCOUNTER — Other Ambulatory Visit (HOSPITAL_COMMUNITY): Payer: Self-pay | Admitting: Nephrology

## 2022-01-25 ENCOUNTER — Other Ambulatory Visit: Payer: Self-pay | Admitting: Nephrology

## 2022-01-25 DIAGNOSIS — E1122 Type 2 diabetes mellitus with diabetic chronic kidney disease: Secondary | ICD-10-CM

## 2022-02-02 ENCOUNTER — Ambulatory Visit (HOSPITAL_COMMUNITY)
Admission: RE | Admit: 2022-02-02 | Discharge: 2022-02-02 | Disposition: A | Discharge status: Home / Self Care | Payer: Managed Care, Other (non HMO) | Source: Ambulatory Visit | Attending: Nephrology | Admitting: Nephrology

## 2022-02-02 DIAGNOSIS — E1122 Type 2 diabetes mellitus with diabetic chronic kidney disease: Secondary | ICD-10-CM | POA: Diagnosis present

## 2022-08-21 ENCOUNTER — Encounter: Payer: Self-pay | Admitting: *Deleted

## 2022-10-09 ENCOUNTER — Other Ambulatory Visit (HOSPITAL_COMMUNITY): Payer: Self-pay | Admitting: Family Medicine

## 2022-10-09 DIAGNOSIS — Z1231 Encounter for screening mammogram for malignant neoplasm of breast: Secondary | ICD-10-CM
